# Patient Record
Sex: Female | Born: 1985 | Race: Black or African American | Hispanic: No | Marital: Single | Smoking: Current some day smoker
Health system: Southern US, Community
[De-identification: ages and names within clinical notes are randomized; demographics above are authoritative.]

## PROBLEM LIST (undated history)

## (undated) ENCOUNTER — Inpatient Hospital Stay: Payer: Self-pay

## (undated) DIAGNOSIS — I10 Essential (primary) hypertension: Secondary | ICD-10-CM

## (undated) DIAGNOSIS — J45909 Unspecified asthma, uncomplicated: Secondary | ICD-10-CM

---

## 2012-05-20 ENCOUNTER — Emergency Department: Payer: Self-pay | Admitting: Emergency Medicine

## 2012-05-20 LAB — CBC WITH DIFFERENTIAL/PLATELET
Basophil #: 0.1 10*3/uL (ref 0.0–0.1)
Eosinophil %: 0.3 %
HCT: 37.9 % (ref 35.0–47.0)
HGB: 12.3 g/dL (ref 12.0–16.0)
Lymphocyte #: 3.2 10*3/uL (ref 1.0–3.6)
Lymphocyte %: 29.9 %
MCH: 26.1 pg (ref 26.0–34.0)
MCHC: 32.3 g/dL (ref 32.0–36.0)
Monocyte #: 0.8 x10 3/mm (ref 0.2–0.9)
Neutrophil #: 6.6 10*3/uL — ABNORMAL HIGH (ref 1.4–6.5)
Neutrophil %: 61.8 %
Platelet: 246 10*3/uL (ref 150–440)
RBC: 4.69 10*6/uL (ref 3.80–5.20)
RDW: 16.1 % — ABNORMAL HIGH (ref 11.5–14.5)
WBC: 10.7 10*3/uL (ref 3.6–11.0)

## 2012-05-20 LAB — COMPREHENSIVE METABOLIC PANEL
Alkaline Phosphatase: 96 U/L (ref 50–136)
BUN: 12 mg/dL (ref 7–18)
Bilirubin,Total: 0.2 mg/dL (ref 0.2–1.0)
Calcium, Total: 9 mg/dL (ref 8.5–10.1)
Co2: 21 mmol/L (ref 21–32)
Creatinine: 1.03 mg/dL (ref 0.60–1.30)
EGFR (African American): >60
Glucose: 117 mg/dL — ABNORMAL HIGH (ref 65–99)
Potassium: 3.3 mmol/L — ABNORMAL LOW (ref 3.5–5.1)
SGOT(AST): 23 U/L (ref 15–37)

## 2012-05-20 LAB — ETHANOL
Ethanol %: 0.342 % (ref 0.000–0.080)
Ethanol: 342 mg/dL

## 2012-05-20 LAB — PROTIME-INR
INR: 1.2
Prothrombin Time: 15.2 secs — ABNORMAL HIGH (ref 11.5–14.7)

## 2016-02-03 NOTE — L&D Delivery Note (Signed)
Delivery Note  First Stage: Labor onset: 10:00am  Augmentation : none Analgesia /Anesthesia intrapartum: Nitrous oxide SROM at 1100, clear fluid  Second Stage: Complete dilation at 1200  Onset of pushing at 1200 FHR second stage 125bpm- Cat II, min-mod variability.   Delivery of a viable female 01/23/17 at 1214 by Jaclyn Carew A [name] delivery of fetal head in ROA position with restitution to ROT. Double nuchal cord;  Anterior then posterior shoulders delivered easily with gentle downward traction; delivered via somersault maneuver. Baby placed on mom's chest, and attended to by peds.  Cord double clamped after cessation of pulsation, cut by [name] Cord blood sample collected  Arterial cord blood sample: 7.28  Third Stage: Placenta delivered spontaneously intact with 3 VC @ 1218; noted to have calcifications and area of blood clot near cord inserttion.  Placenta disposition: to pathology Uterine tone: firm / bleeding: small to moderate  Right periurethral laceration identified  Anesthesia for repair: Lidocaine; Toradol given Repair: single interrupted stitch 3-0 Vicryl SH Est. Blood Loss (mL): 75ml  Complications: Precipitous delivery  Mom to postpartum.  Baby to Couplet care / Skin to Skin.  Newborn: Birth Weight: 2460gms Apgar Scores: 8/9 Feeding planned: Formula

## 2016-05-06 ENCOUNTER — Emergency Department: Payer: No Typology Code available for payment source

## 2016-05-06 ENCOUNTER — Encounter: Payer: Self-pay | Admitting: Radiology

## 2016-05-06 ENCOUNTER — Emergency Department
Admission: EM | Admit: 2016-05-06 | Discharge: 2016-05-06 | Disposition: A | Discharge status: Home / Self Care | Payer: No Typology Code available for payment source | Attending: Emergency Medicine | Admitting: Emergency Medicine

## 2016-05-06 DIAGNOSIS — F10929 Alcohol use, unspecified with intoxication, unspecified: Secondary | ICD-10-CM

## 2016-05-06 DIAGNOSIS — M542 Cervicalgia: Secondary | ICD-10-CM | POA: Insufficient documentation

## 2016-05-06 DIAGNOSIS — M549 Dorsalgia, unspecified: Secondary | ICD-10-CM | POA: Insufficient documentation

## 2016-05-06 DIAGNOSIS — Y999 Unspecified external cause status: Secondary | ICD-10-CM | POA: Insufficient documentation

## 2016-05-06 DIAGNOSIS — F10129 Alcohol abuse with intoxication, unspecified: Secondary | ICD-10-CM | POA: Insufficient documentation

## 2016-05-06 DIAGNOSIS — R51 Headache: Secondary | ICD-10-CM | POA: Diagnosis not present

## 2016-05-06 DIAGNOSIS — F1490 Cocaine use, unspecified, uncomplicated: Secondary | ICD-10-CM | POA: Diagnosis not present

## 2016-05-06 DIAGNOSIS — M7918 Myalgia, other site: Secondary | ICD-10-CM

## 2016-05-06 DIAGNOSIS — Y92410 Unspecified street and highway as the place of occurrence of the external cause: Secondary | ICD-10-CM | POA: Diagnosis not present

## 2016-05-06 DIAGNOSIS — Y939 Activity, unspecified: Secondary | ICD-10-CM | POA: Insufficient documentation

## 2016-05-06 DIAGNOSIS — S199XXA Unspecified injury of neck, initial encounter: Secondary | ICD-10-CM | POA: Diagnosis present

## 2016-05-06 LAB — URINE DRUG SCREEN, QUALITATIVE (ARMC ONLY)
AMPHETAMINES, UR SCREEN: NOT DETECTED
BENZODIAZEPINE, UR SCRN: NOT DETECTED
Barbiturates, Ur Screen: NOT DETECTED
Cannabinoid 50 Ng, Ur ~~LOC~~: NOT DETECTED
Cocaine Metabolite,Ur ~~LOC~~: POSITIVE — AB
MDMA (Ecstasy)Ur Screen: NOT DETECTED
METHADONE SCREEN, URINE: NOT DETECTED
Opiate, Ur Screen: NOT DETECTED
Phencyclidine (PCP) Ur S: NOT DETECTED
TRICYCLIC, UR SCREEN: NOT DETECTED

## 2016-05-06 LAB — ETHANOL: ALCOHOL ETHYL (B): 319 mg/dL — AB (ref <5)

## 2016-05-06 MED ORDER — IOPAMIDOL (ISOVUE-300) INJECTION 61%
100.0000 mL | Freq: Once | INTRAVENOUS | Status: AC | PRN
  Administered 2016-05-06: 100 mL via INTRAVENOUS

## 2016-05-06 MED ORDER — ONDANSETRON HCL 4 MG/2ML IJ SOLN
4.0000 mg | Freq: Once | INTRAMUSCULAR | Status: AC
  Administered 2016-05-06: 4 mg via INTRAVENOUS
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
## 2016-05-06 NOTE — ED Notes (Signed)
One episode of vomiting; Zofran given per MD order

## 2016-05-06 NOTE — ED Notes (Signed)
Critical ETOH level called from Lab, EDP notified.
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
## 2016-05-06 NOTE — ED Notes (Signed)
Pt refusing to keep blood pressure cuff, heart monitor and O2 sat on at this time.  Pt belligerent and labile at this time.

## 2016-10-08 ENCOUNTER — Observation Stay
Admission: EM | Admit: 2016-10-08 | Discharge: 2016-10-08 | Disposition: A | Discharge status: Home / Self Care | Payer: Medicaid Other | Attending: Obstetrics and Gynecology | Admitting: Obstetrics and Gynecology

## 2016-10-08 ENCOUNTER — Observation Stay: Payer: Medicaid Other

## 2016-10-08 DIAGNOSIS — Z3A26 26 weeks gestation of pregnancy: Secondary | ICD-10-CM | POA: Insufficient documentation

## 2016-10-08 DIAGNOSIS — F1910 Other psychoactive substance abuse, uncomplicated: Secondary | ICD-10-CM

## 2016-10-08 DIAGNOSIS — O368120 Decreased fetal movements, second trimester, not applicable or unspecified: Secondary | ICD-10-CM | POA: Diagnosis not present

## 2016-10-08 DIAGNOSIS — N939 Abnormal uterine and vaginal bleeding, unspecified: Secondary | ICD-10-CM | POA: Diagnosis present

## 2016-10-08 DIAGNOSIS — O0933 Supervision of pregnancy with insufficient antenatal care, third trimester: Secondary | ICD-10-CM

## 2016-10-08 DIAGNOSIS — O4690 Antepartum hemorrhage, unspecified, unspecified trimester: Secondary | ICD-10-CM

## 2016-10-08 LAB — CHLAMYDIA/NGC RT PCR (ARMC ONLY)
CHLAMYDIA TR: NOT DETECTED
N gonorrhoeae: NOT DETECTED

## 2016-10-08 LAB — URINE DRUG SCREEN, QUALITATIVE (ARMC ONLY)
AMPHETAMINES, UR SCREEN: NOT DETECTED
Barbiturates, Ur Screen: NOT DETECTED
Benzodiazepine, Ur Scrn: NOT DETECTED
CANNABINOID 50 NG, UR ~~LOC~~: NOT DETECTED
COCAINE METABOLITE, UR ~~LOC~~: NOT DETECTED
MDMA (ECSTASY) UR SCREEN: NOT DETECTED
Methadone Scn, Ur: NOT DETECTED
Opiate, Ur Screen: NOT DETECTED
PHENCYCLIDINE (PCP) UR S: NOT DETECTED
Tricyclic, Ur Screen: NOT DETECTED

## 2016-10-08 LAB — URINALYSIS, ROUTINE W REFLEX MICROSCOPIC
BILIRUBIN URINE: NEGATIVE
Glucose, UA: NEGATIVE mg/dL
HGB URINE DIPSTICK: NEGATIVE
KETONES UR: NEGATIVE mg/dL
Leukocytes, UA: NEGATIVE
NITRITE: NEGATIVE
PH: 6 (ref 5.0–8.0)
Protein, ur: NEGATIVE mg/dL
Specific Gravity, Urine: 1.012 (ref 1.005–1.030)

## 2016-10-08 NOTE — OB Triage Note (Signed)
Pt presenst c/o of a fall yesterday around 10 AM. Pt states she landed on her back. No complaints of pain but says she has had decreased fetal movement, and light spotting noticed when she went to the bathroom. Denies any LOF. Pt has not had any PNC. States this was an unplanned pregnancy. Pt is unsure how far along she is but based of her missed period she is estimating about 25 weeks. Will continue to monitor.

## 2016-10-08 NOTE — Discharge Summary (Signed)
Reviewed Discharge instructions with patient. Went over the importance of establishing prenatal care for this pregnancy. Advised pt of what to look for and when she should seek medical attention. Gave pt opportunity for questions. Pt verbalized understanding of all information given. Pt discharged home

## 2016-10-08 NOTE — Final Progress Note (Signed)
L&D OB Triage Note  Dominique Baker is an unassigned 31 y.o. [id number] female at 4070w5d, EDD Estimated Date of Delivery: 01/09/17 (by approximate LMP of 04/05/2016) who presented to triage for complaints of vaginal spotting (mostly when wiping).  Notes that she was s/p ground level fall yesterday (however reports that she landed on her back). Is noting decreased fetal movement today.  She was evaluated by the nurses with no significant findings for vaginal bleeding. Vital signs stable. An NST was performed and has been reviewed by MD.      NST INTERPRETATION: Indications: decreased fetal movement  Mode:  (off for pt to go to ultrasound) Baseline Rate (A): 143 bpm (fht) Variability: Moderate Accelerations: 10 x 10 Decelerations: None     Contraction Frequency (min): none  Impression: reactive    Labs:  Results for orders placed or performed during the hospital encounter of 10/08/16  Chlamydia/NGC rt PCR ([facility] only)  Result Value Ref Range   Specimen source GC/Chlam URINE, RANDOM    Chlamydia Tr NOT DETECTED NOT DETECTED   N gonorrhoeae NOT DETECTED NOT DETECTED  Urinalysis, Routine w reflex microscopic  Result Value Ref Range   Color, Urine YELLOW (A) YELLOW   APPearance CLEAR (A) CLEAR   Specific Gravity, Urine 1.012 1.005 - 1.030   pH 6.0 5.0 - 8.0   Glucose, UA NEGATIVE NEGATIVE mg/dL   Hgb urine dipstick NEGATIVE NEGATIVE   Bilirubin Urine NEGATIVE NEGATIVE   Ketones, ur NEGATIVE NEGATIVE mg/dL   Protein, ur NEGATIVE NEGATIVE mg/dL   Nitrite NEGATIVE NEGATIVE   Leukocytes, UA NEGATIVE NEGATIVE  Urine Drug Screen, Qualitative ([facility] only)  Result Value Ref Range   Tricyclic, Ur Screen NONE DETECTED NONE DETECTED   Amphetamines, Ur Screen NONE DETECTED NONE DETECTED   MDMA (Ecstasy)Ur Screen NONE DETECTED NONE DETECTED   Cocaine Metabolite,Ur [place] NONE DETECTED NONE DETECTED   Opiate, Ur Screen NONE DETECTED NONE DETECTED   Phencyclidine (PCP) Ur S NONE DETECTED NONE  DETECTED   Cannabinoid 50 Ng, Ur [place] NONE DETECTED NONE DETECTED   Barbiturates, Ur Screen NONE DETECTED NONE DETECTED   Benzodiazepine, Ur Scrn NONE DETECTED NONE DETECTED   Methadone Scn, Ur NONE DETECTED NONE DETECTED     Imaging:  CLINICAL DATA:  Vaginal bleeding after falling yesterday. Unsure dates. Evaluate growth and placenta. No prenatal care.  EXAM: OBSTETRICAL ULTRASOUND >14 WKS  FINDINGS: Number of Fetuses: 1  Heart Rate:  139 bpm  Movement: Present  Presentation: Cephalic  Previa: None the distance from the internal cervical os to the inferior margin of the placenta is 7.2 cm  Placental Location: Posterior  Amniotic Fluid (Subjective): Normal  Vertical pocket 3.7 cm  FETAL BIOMETRY  BPD:  5.19cm 21w 5d  HC:    19.41cm  21w   4d  AC:   17.45cm  22w   3d  FL:   3.98cm  22w   6d  Current Mean GA: 22w 1d              US EDC: February 11, 2016  FETAL ANATOMY  Lateral Ventricles: Visualized  [place]/CSP: Visualized  Posterior Fossa:  Visualized  Upper Lip: Visualized  Spine: Visualized  4 Chamber Heart on Left: Visualized  LVOT: Visualized  RVOT: Visualized  Stomach on Left: Visualized but the stomach never appeared fully distended during the study  3 Vessel Cord: Visualized  Cord Insertion site: Visualized  Kidneys: Visualized  Bladder: Visualized  Extremities: Visualized  Sex: Female genitalia  Technically difficult  due to: n/a  Maternal Findings:  No evidence of placental abruption.  Cervix:  3.5 cm and appears closed  IMPRESSION: Single viable IUP with estimated gestational age of [redacted] weeks 1 day with estimated date of confinement of February 11, 2016. No fetal anomalies were demonstrated. The fetal stomach never appeared fully distended during the course of the study.  The placenta is posterior with no evidence of previa or abruption. The amniotic fluid volume is estimated to be  normal. The cervix appears closed.  Plan: NST performed was reviewed and was found to be reactive. Review of prior ER records from ER visit in April showed use of cocaine and ETOH in early pregnancy. UDS today negative.  Ultrasound performed to assess placenta, as well as dating/anatomy. noted that patient's dates were incorrect, is currently only ~22 weeks.  Patient informed. She was discharged home with bleeding/preterm labor precautions.  Advised to establish routine prenatal care. Given information to follow up at [facility].     Hildred Laser, MD [facility]

## 2016-10-09 DIAGNOSIS — F1910 Other psychoactive substance abuse, uncomplicated: Secondary | ICD-10-CM

## 2016-10-27 ENCOUNTER — Telehealth: Payer: Self-pay | Admitting: Obstetrics and Gynecology

## 2016-10-27 NOTE — Telephone Encounter (Signed)
Attempted to contact patient - mailbox is full - unable to leave a message - will attempt again later

## 2016-10-27 NOTE — Telephone Encounter (Signed)
-----   Message from Hildred Laser, MD sent at 10/21/2016  7:42 PM EDT ----- Regarding: follow up OB appointment Please contact this patient to see if she has established prenatal care. She was an unassigned triage patient in L&D several weeks ago, and I would like to have her scheduled to f/u in our office if she has not established care anywhere else.   Thanks,   Dr. Valentino Saxon

## 2016-10-30 NOTE — Telephone Encounter (Signed)
Attempted to contact patient again - no answer - voicemail full

## 2016-11-16 NOTE — Telephone Encounter (Signed)
3rd attempt to contact patient - no answer - mailbox full

## 2017-01-23 ENCOUNTER — Inpatient Hospital Stay
Admission: EM | Admit: 2017-01-23 | Discharge: 2017-01-26 | DRG: 806 | Disposition: A | Discharge status: Home / Self Care | Payer: Medicaid Other | Attending: Obstetrics and Gynecology | Admitting: Obstetrics and Gynecology

## 2017-01-23 ENCOUNTER — Other Ambulatory Visit: Payer: Self-pay

## 2017-01-23 DIAGNOSIS — Z87891 Personal history of nicotine dependence: Secondary | ICD-10-CM

## 2017-01-23 DIAGNOSIS — O114 Pre-existing hypertension with pre-eclampsia, complicating childbirth: Principal | ICD-10-CM | POA: Diagnosis present

## 2017-01-23 DIAGNOSIS — O1494 Unspecified pre-eclampsia, complicating childbirth: Secondary | ICD-10-CM

## 2017-01-23 DIAGNOSIS — O1002 Pre-existing essential hypertension complicating childbirth: Secondary | ICD-10-CM | POA: Diagnosis present

## 2017-01-23 DIAGNOSIS — Z3483 Encounter for supervision of other normal pregnancy, third trimester: Secondary | ICD-10-CM | POA: Diagnosis present

## 2017-01-23 DIAGNOSIS — Z3A37 37 weeks gestation of pregnancy: Secondary | ICD-10-CM

## 2017-01-23 DIAGNOSIS — D62 Acute posthemorrhagic anemia: Secondary | ICD-10-CM | POA: Diagnosis not present

## 2017-01-23 DIAGNOSIS — O9081 Anemia of the puerperium: Secondary | ICD-10-CM | POA: Diagnosis not present

## 2017-01-23 DIAGNOSIS — O0933 Supervision of pregnancy with insufficient antenatal care, third trimester: Secondary | ICD-10-CM

## 2017-01-23 LAB — COMPREHENSIVE METABOLIC PANEL
ALBUMIN: 2.9 g/dL — AB (ref 3.5–5.0)
ALK PHOS: 145 U/L — AB (ref 38–126)
ALT: 13 U/L — ABNORMAL LOW (ref 14–54)
ANION GAP: 9 (ref 5–15)
AST: 27 U/L (ref 15–41)
BILIRUBIN TOTAL: 0.7 mg/dL (ref 0.3–1.2)
BUN: 5 mg/dL — AB (ref 6–20)
CALCIUM: 8.9 mg/dL (ref 8.9–10.3)
CO2: 21 mmol/L — ABNORMAL LOW (ref 22–32)
Chloride: 106 mmol/L (ref 101–111)
Creatinine, Ser: 0.74 mg/dL (ref 0.44–1.00)
GFR calc Af Amer: >60 mL/min (ref >60)
GLUCOSE: 99 mg/dL (ref 65–99)
POTASSIUM: 3 mmol/L — AB (ref 3.5–5.1)
Sodium: 136 mmol/L (ref 135–145)
TOTAL PROTEIN: 6.7 g/dL (ref 6.5–8.1)

## 2017-01-23 LAB — URINE DRUG SCREEN, QUALITATIVE (ARMC ONLY)
Amphetamines, Ur Screen: NOT DETECTED
BARBITURATES, UR SCREEN: NOT DETECTED
BENZODIAZEPINE, UR SCRN: NOT DETECTED
CANNABINOID 50 NG, UR ~~LOC~~: NOT DETECTED
Cocaine Metabolite,Ur ~~LOC~~: NOT DETECTED
MDMA (ECSTASY) UR SCREEN: NOT DETECTED
Methadone Scn, Ur: NOT DETECTED
Opiate, Ur Screen: NOT DETECTED
PHENCYCLIDINE (PCP) UR S: NOT DETECTED
TRICYCLIC, UR SCREEN: NOT DETECTED

## 2017-01-23 LAB — CBC
HEMATOCRIT: 30.7 % — AB (ref 35.0–47.0)
HEMOGLOBIN: 9.8 g/dL — AB (ref 12.0–16.0)
MCH: 25.2 pg — ABNORMAL LOW (ref 26.0–34.0)
MCHC: 31.8 g/dL — AB (ref 32.0–36.0)
MCV: 79.1 fL — ABNORMAL LOW (ref 80.0–100.0)
Platelets: 145 10*3/uL — ABNORMAL LOW (ref 150–440)
RBC: 3.88 MIL/uL (ref 3.80–5.20)
RDW: 16.2 % — AB (ref 11.5–14.5)
WBC: 11.1 10*3/uL — AB (ref 3.6–11.0)

## 2017-01-23 LAB — TYPE AND SCREEN
ABO/RH(D): O POS
ANTIBODY SCREEN: NEGATIVE

## 2017-01-23 LAB — RAPID HIV SCREEN (HIV 1/2 AB+AG)
HIV 1/2 ANTIBODIES: NONREACTIVE
HIV-1 P24 ANTIGEN - HIV24: NONREACTIVE

## 2017-01-23 LAB — PROTEIN / CREATININE RATIO, URINE
Creatinine, Urine: 51 mg/dL
Protein Creatinine Ratio: 0.37 mg/mg{Cre} — ABNORMAL HIGH (ref 0.00–0.15)
Total Protein, Urine: 19 mg/dL

## 2017-01-23 LAB — CHLAMYDIA/NGC RT PCR (ARMC ONLY)
CHLAMYDIA TR: NOT DETECTED
N gonorrhoeae: NOT DETECTED

## 2017-01-23 LAB — OB RESULTS CONSOLE HIV ANTIBODY (ROUTINE TESTING): HIV: NONREACTIVE

## 2017-01-23 MED ORDER — LACTATED RINGERS IV SOLN
INTRAVENOUS | Status: DC
  Administered 2017-01-23 – 2017-01-24 (×3): via INTRAVENOUS

## 2017-01-23 MED ORDER — FLEET ENEMA 7-19 GM/118ML RE ENEM: 1.0000 | ENEMA | Freq: Every day | RECTAL | Status: DC | PRN

## 2017-01-23 MED ORDER — BENZOCAINE-MENTHOL 20-0.5 % EX AERO: 1.0000 "application " | INHALATION_SPRAY | CUTANEOUS | Status: DC | PRN

## 2017-01-23 MED ORDER — OXYTOCIN 10 UNIT/ML IJ SOLN
INTRAMUSCULAR | Status: AC
Start: 2017-01-23 — End: 2017-01-23
  Filled 2017-01-23: qty 2

## 2017-01-23 MED ORDER — AMMONIA AROMATIC IN INHA
RESPIRATORY_TRACT | Status: AC
  Filled 2017-01-23: qty 10

## 2017-01-23 MED ORDER — WITCH HAZEL-GLYCERIN EX PADS: 1.0000 "application " | MEDICATED_PAD | CUTANEOUS | Status: DC | PRN

## 2017-01-23 MED ORDER — BUTORPHANOL TARTRATE 1 MG/ML IJ SOLN: 1.0000 mg | INTRAMUSCULAR | Status: DC | PRN

## 2017-01-23 MED ORDER — OXYTOCIN 40 UNITS IN LACTATED RINGERS INFUSION - SIMPLE MED
2.5000 [IU]/h | INTRAVENOUS | Status: DC
  Administered 2017-01-23: 2.5 [IU]/h via INTRAVENOUS

## 2017-01-23 MED ORDER — CALCIUM GLUCONATE 10 % IV SOLN
INTRAVENOUS | Status: AC
Start: 2017-01-23 — End: 2017-01-24
  Filled 2017-01-23: qty 10

## 2017-01-23 MED ORDER — COCONUT OIL OIL: 1.0000 "application " | TOPICAL_OIL | Status: DC | PRN

## 2017-01-23 MED ORDER — LABETALOL HCL 5 MG/ML IV SOLN
20.0000 mg | INTRAVENOUS | Status: DC | PRN
  Administered 2017-01-24: 20 mg via INTRAVENOUS
  Filled 2017-01-23: qty 4
  Filled 2017-01-23: qty 16

## 2017-01-23 MED ORDER — SODIUM CHLORIDE 0.9% FLUSH: 3.0000 mL | INTRAVENOUS | Status: DC | PRN

## 2017-01-23 MED ORDER — ONDANSETRON HCL 4 MG PO TABS: 4.0000 mg | ORAL_TABLET | ORAL | Status: DC | PRN

## 2017-01-23 MED ORDER — SODIUM CHLORIDE 0.9% FLUSH
3.0000 mL | Freq: Two times a day (BID) | INTRAVENOUS | Status: DC
  Administered 2017-01-25 (×3): 3 mL via INTRAVENOUS

## 2017-01-23 MED ORDER — DIBUCAINE 1 % RE OINT: 1.0000 "application " | TOPICAL_OINTMENT | RECTAL | Status: DC | PRN

## 2017-01-23 MED ORDER — MISOPROSTOL 200 MCG PO TABS
ORAL_TABLET | ORAL | Status: AC
  Filled 2017-01-23: qty 4

## 2017-01-23 MED ORDER — PRENATAL MULTIVITAMIN CH
1.0000 | ORAL_TABLET | Freq: Every day | ORAL | Status: DC
  Administered 2017-01-25 – 2017-01-26 (×2): 1 via ORAL
  Filled 2017-01-23 (×2): qty 1

## 2017-01-23 MED ORDER — SIMETHICONE 80 MG PO CHEW: 80.0000 mg | CHEWABLE_TABLET | ORAL | Status: DC | PRN

## 2017-01-23 MED ORDER — MEASLES, MUMPS & RUBELLA VAC ~~LOC~~ INJ
0.5000 mL | INJECTION | Freq: Once | SUBCUTANEOUS | Status: DC
  Filled 2017-01-23: qty 0.5

## 2017-01-23 MED ORDER — LABETALOL HCL 5 MG/ML IV SOLN: 20.0000 mg | Freq: Once | INTRAVENOUS | Status: DC

## 2017-01-23 MED ORDER — TETANUS-DIPHTH-ACELL PERTUSSIS 5-2.5-18.5 LF-MCG/0.5 IM SUSP
0.5000 mL | Freq: Once | INTRAMUSCULAR | Status: AC
  Administered 2017-01-26: 0.5 mL via INTRAMUSCULAR
  Filled 2017-01-23 (×2): qty 0.5

## 2017-01-23 MED ORDER — ONDANSETRON HCL 4 MG/2ML IJ SOLN: 4.0000 mg | Freq: Four times a day (QID) | INTRAMUSCULAR | Status: DC | PRN

## 2017-01-23 MED ORDER — ONDANSETRON HCL 4 MG/2ML IJ SOLN: 4.0000 mg | INTRAMUSCULAR | Status: DC | PRN

## 2017-01-23 MED ORDER — ACETAMINOPHEN 325 MG PO TABS: 650.0000 mg | ORAL_TABLET | ORAL | Status: DC | PRN

## 2017-01-23 MED ORDER — DIPHENHYDRAMINE HCL 25 MG PO CAPS: 25.0000 mg | ORAL_CAPSULE | Freq: Four times a day (QID) | ORAL | Status: DC | PRN

## 2017-01-23 MED ORDER — SODIUM CHLORIDE 0.9 % IV SOLN: 250.0000 mL | INTRAVENOUS | Status: DC | PRN

## 2017-01-23 MED ORDER — OXYTOCIN 40 UNITS IN LACTATED RINGERS INFUSION - SIMPLE MED
INTRAVENOUS | Status: AC
  Administered 2017-01-23: 500 mL via INTRAVENOUS
  Filled 2017-01-23: qty 1000

## 2017-01-23 MED ORDER — ZOLPIDEM TARTRATE 5 MG PO TABS: 5.0000 mg | ORAL_TABLET | Freq: Every evening | ORAL | Status: DC | PRN

## 2017-01-23 MED ORDER — SENNOSIDES-DOCUSATE SODIUM 8.6-50 MG PO TABS
2.0000 | ORAL_TABLET | ORAL | Status: DC
  Administered 2017-01-24 – 2017-01-25 (×3): 2 via ORAL
  Filled 2017-01-23 (×4): qty 2

## 2017-01-23 MED ORDER — LABETALOL HCL 5 MG/ML IV SOLN
INTRAVENOUS | Status: AC
  Administered 2017-01-23: 20 mg
  Filled 2017-01-23: qty 4

## 2017-01-23 MED ORDER — BISACODYL 10 MG RE SUPP
10.0000 mg | Freq: Every day | RECTAL | Status: DC | PRN
  Filled 2017-01-23: qty 1

## 2017-01-23 MED ORDER — MAGNESIUM SULFATE 50 % IJ SOLN
INTRAVENOUS | Status: AC
  Filled 2017-01-23: qty 80

## 2017-01-23 MED ORDER — LIDOCAINE HCL (PF) 1 % IJ SOLN
INTRAMUSCULAR | Status: AC
  Administered 2017-01-23: 30 mL via SUBCUTANEOUS
  Filled 2017-01-23: qty 30

## 2017-01-23 MED ORDER — LABETALOL HCL 5 MG/ML IV SOLN: 20.0000 mg | INTRAVENOUS | Status: DC | PRN

## 2017-01-23 MED ORDER — KETOROLAC TROMETHAMINE 30 MG/ML IJ SOLN
INTRAMUSCULAR | Status: AC
  Administered 2017-01-23: 60 mg via INTRAMUSCULAR
  Filled 2017-01-23: qty 2

## 2017-01-23 MED ORDER — OXYTOCIN BOLUS FROM INFUSION
500.0000 mL | Freq: Once | INTRAVENOUS | Status: AC
  Administered 2017-01-23: 500 mL via INTRAVENOUS

## 2017-01-23 MED ORDER — IBUPROFEN 600 MG PO TABS
600.0000 mg | ORAL_TABLET | Freq: Four times a day (QID) | ORAL | Status: DC
  Administered 2017-01-23 – 2017-01-24 (×4): 600 mg via ORAL
  Filled 2017-01-23 (×4): qty 1

## 2017-01-23 MED ORDER — KETOROLAC TROMETHAMINE 60 MG/2ML IM SOLN
60.0000 mg | Freq: Once | INTRAMUSCULAR | Status: AC
  Administered 2017-01-23: 60 mg via INTRAMUSCULAR
  Filled 2017-01-23: qty 2

## 2017-01-23 MED ORDER — MAGNESIUM SULFATE BOLUS VIA INFUSION
4.0000 g | Freq: Once | INTRAVENOUS | Status: AC
  Administered 2017-01-23: 4 g via INTRAVENOUS
  Filled 2017-01-23: qty 500

## 2017-01-23 MED ORDER — SOD CITRATE-CITRIC ACID 500-334 MG/5ML PO SOLN: 30.0000 mL | ORAL | Status: DC | PRN

## 2017-01-23 MED ORDER — LACTATED RINGERS IV SOLN: 500.0000 mL | INTRAVENOUS | Status: DC | PRN

## 2017-01-23 MED ORDER — MAGNESIUM SULFATE 50 % IJ SOLN
2.0000 g/h | INTRAVENOUS | Status: DC
  Filled 2017-01-23: qty 80

## 2017-01-23 MED ORDER — LIDOCAINE HCL (PF) 1 % IJ SOLN
30.0000 mL | INTRAMUSCULAR | Status: DC | PRN
  Administered 2017-01-23: 30 mL via SUBCUTANEOUS

## 2017-01-23 MED ORDER — OXYCODONE HCL 5 MG PO TABS: 5.0000 mg | ORAL_TABLET | ORAL | Status: DC | PRN

## 2017-01-23 NOTE — Progress Notes (Signed)
Post Partum Day 0 Subjective:  Notified by RN of persistent elevated BPS in severe range 160-180/100-110. Pt now reporting remote hx elevated BPs on medications in the past.  No CP SOB Fever,Chills, N/V or leg pain; denies nipple or breast pain Denies HA change of vision, RUQ/epigastric pain  Objective:  BP (!) 148/72   Pulse 74   Temp (!) 97.3 F (36.3 C) (Oral)   Resp 18   Ht _0  (1.676 m)   Wt 254 lb (115.2 kg)   SpO2 100%   BMI 41.00 kg/m    Physical Exam:  General: NAD Breasts: soft/nontender CV: RRR Pulm: nl effort, CTABL Abdomen: soft, NT, BS x 4 Perineum: minimal edema, lacerations hemostatic/repair well approximated Lochia: small Uterine Fundus: fundus firm and 1 fb below umbilicus DVT Evaluation: no cords, ttp LEs   Recent Labs    01/23/17 1233  HGB 9.8*  HCT 30.7*  WBC 11.1*  PLT 145*    Assessment/Plan: 31 y.o. [id number] postpartum day # 0  1. Pre-eclampsia with severe features- - Magnesium 4gm load, 2gm/hr -strict I/Os - 1 dose Labetalol 25m IV given-BPs now 148-169/69-93. - repeat labs in AM  -POC d/w and comanaged with Dr WLeonides Schanz    2. S/P SVD at [redacted]w[redacted]d Continue routine PP care - encouraged snug fitting bra and cabbage leaves for bottlefeeding.   - Immunization status: Needs  tdap and flu, consider varicella, MMR prior to DC   Disposition: remain on L&D for magnesium sulfate admin- close monitoring.     Jabrea Kallstrom, RENew KensingtonCNM 01/23/17 2:48 PM
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
## 2017-01-23 NOTE — H&P (Signed)
OB History & Physical   History of Present Illness:  Chief Complaint: Painful UCs and water broke at 1100  HPI:  Dominique Baker is Baker 31 y.o. [id number] female at 4160w3d dated by US at 627w1d.  She presents to L&D for labor and SROM  Active FM onset of ctx @ 1100 currently every 2-3 minutes LOF  / SROM @ 1100,, clear fluid bloody show normal    Pregnancy Issues: 1. No prenatal care, seen at [facility] 10/2016 for confirmation of pregnancy.  2. Hx substance abuse- +cocaine 10/2016   Maternal Medical History:  History reviewed. No pertinent past medical history.   History reviewed. No pertinent surgical history.  No Known Allergies  Prior to Admission medications   Not on File     Prenatal care site: no care Reports hx preterm birth x 2, SVD x 3- 1st term 6lbs, 12yo; , 2nd baby approx 2 lbs with NICU stay, age 31yo, 3rd baby age 61-also preterm.   Social History: She  reports that she quit smoking about 6 months ago. She smoked 0.25 packs per day. she has never used smokeless tobacco. She reports that she does not drink alcohol or use drugs.  Family History: family history is not on file. family hx diabetes.   Review of Systems: Baker full review of systems was performed and negative except as noted in the HPI.     Physical Exam:  Vital Signs: There were no vitals taken for this visit. initial BP elevated x 2 General: no acute distress. Crying with UCs  HEENT: normocephalic, atraumatic Heart: regular rate & rhythm.  No murmurs/rubs/gallops Lungs: clear to auscultation bilaterally, normal respiratory effort Abdomen: soft, gravid, non-tender;  EFW: 6lbs Pelvic:   External: Normal external female genitalia  Cervix: Dilation: 4 / Effacement (%): 90 / Station: -2    Extremities: non-tender, symmetric,trace edema bilaterally.  DTRs: 2+ Neurologic: Alert & oriented x 3.    No results found for this or any previous visit (from the past 24 hour(s)).  Pertinent Results:  Prenatal  Labs: Blood type/Rh pending  Antibody screen   Rubella   Varicella   RPR   HBsAg   HIV   GC Neg 10/08/16  Chlamydia Neg 10/08/16  Genetic screening Not done  1 hour GTT Not done  3 hour GTT   GBS Not done   FHT: 130bpm, mod variability, + accels, 1 variable decel noted with UC.  TOCO: not tracing well, palpate UCs q2-593min SVE:  Dilation: 4 / Effacement (%): 90 / Station: -2    Cephalic by leopolds and RN SVE.   No results found.  Assessment:  Dominique Baker is Baker 31 y.o. [phone] female at 7960w3d with ruptured membranes and labor.  1. No prenatal care, current pregnancy 2. Hx substance abuse.  Plan:  1. Admit to Labor & Delivery; consents reviewed and obtained  2. Fetal Well being  - Fetal Tracing: Cat I tracing, 1 variable decel earlier.  - Group B Streptococcus ppx indicated: none- term pt - Presentation: vertex confirmed by exam   3. Routine OB: - Prenatal labs reviewed, as above - Rh-pending.  - CBC, T&S, RPR- prenatal labs on admit - Clear fluids, IVF  4. Monitoring of Labor -  Contractions: external toco in place -  Pelvis proven to approx 6lbs -  Plan for continuous fetal monitoring  -  Maternal pain control as desired; requesting IVPM, nitrous, regional anesthesia - Anticipate vaginal delivery  5. UDS sent due to hx  substance abuse, SW consult, d/t no care and hx.   6. Elevated BPs- pt reports hx HTN previously on meds. Labs ordered, will monitor closely  Dominique Baker, [name] 01/23/17 12:36 PM

## 2017-01-23 NOTE — Discharge Summary (Signed)
Obstetrical Discharge Summary  Patient Name: Dominique Baker DOB: February 28, 1985 MRN: [id number]  Date of Admission: 01/23/2017 Date of Delivery: 01/23/17 Delivered by: Bonnell Public McVey [name] Date of Discharge: 01/26/2017  Primary OB: no prenatal care LMP:No LMP recorded. Patient is pregnant. EDC Estimated Date of Delivery: 02/10/17 Gestational Age at Delivery: 1635w3d   Antepartum complications: no prenatal care Admitting Diagnosis: ruptured membranes, term Secondary Diagnosis: hx substance abuse 10/2016  Patient Active Problem List   Diagnosis Date Noted  . Indication for care in labor or delivery 01/23/2017  . Pre-eclampsia, delivered 01/23/2017  . Polysubstance abuse (HCC) 10/09/2016  . Vaginal spotting 10/08/2016  . No prenatal care in current pregnancy in third trimester 10/08/2016    Augmentation: none Complications: None Intrapartum complications/course: none Date of Delivery: 01/23/17 Delivered By: Genia DelMargaret Joaopedro Eschbach, [name] Delivery Type: spontaneous vaginal delivery Anesthesia: Nitrous oxide Placenta: spontaneous Laceration: right periurethral Episiotomy: none Newborn Data: Live born female   Newborn Delivery   Birth date/time:  01/23/2017 12:14:00 Delivery type:  Vaginal, Spontaneous     Post partum course:  Patient had a postpartum course complicated by preeclampsia with severe range blood pressures. She received a 4g bolus of magnesium sulfate, followed by 24 hours of continous magnesium sulfate 2g/hr. She was given 20mg  of IV labetalol, which was followed by PO nifedipine. Her BP continued to be elevated, at which point PO labetalol was added.   By time of discharge on PPD#3, her pain was controlled on oral pain medications, she had appropriate lochia, was ambulating, voiding without difficulty and tolerating regular diet. Her blood pressure was controlled on 60mg  PO nifedipine qd and 400mg  PO labetalol BID.  She was deemed stable for discharge to home.    Discharge Physical  Exam:  BP 136/80 (BP Location: Right Arm)   Pulse 75   Temp (!) 97.4 F (36.3 C) (Oral)   Resp 18   Ht 5\' 6"  (1.676 m)   Wt 115.2 kg (254 lb)   SpO2 99%   BMI 41.00 kg/m   General: NAD CV: RRR Pulm: CTABL, nl effort ABD: s/nd/nt, fundus firm and below the umbilicus Lochia: moderate DVT Evaluation: LE non-ttp, no evidence of DVT on exam.  Hemoglobin  Date Value Ref Range Status  01/24/2017 9.1 (L) 12.0 - 16.0 g/dL Final   HGB  Date Value Ref Range Status  05/20/2012 12.3 12.0 - 16.0 g/dL Final   HCT  Date Value Ref Range Status  01/24/2017 28.9 (L) 35.0 - 47.0 % Final  05/20/2012 37.9 35.0 - 47.0 % Final     Disposition: stable, discharge to home Baby Feeding: formula Baby Disposition: home with mom  Rh Immune globulin given: n/a Rubella vaccine given: n/a Varicella vaccine given: status unknown d/t lack of PNC Tdap vaccine given in AP: unknown Flu vaccine given in AP: unknown  Contraception: still deciding  Prenatal Labs: Blood type/Rh O+  Antibody screen neg  Rubella immune  Varicella Not done  RPR NR  HBsAg neg  HIV NR  GC Neg 10/08/16  Chlamydia Neg 10/08/16  Genetic screening Not done  1 hour GTT Not done  GBS Not done   UDS: Negative on admission   Plan:  Dominique Baker was discharged to home in good condition. Reviewed the importance of good follow-up. Advised that she should make appointments at [facility] for a blood pressure check this Friday as well as a routine 6 week postpartum visit with [name]. She should also make an appointment with her PCP as  soon as possible for management of blood pressure.   Evening dose of labetalol to be given before discharge, with recheck of BP about an hour later. Reviewed the importance of blood pressure management and need to pick up prescriptions from the pharmacy first thing tomorrow morning to stay on schedule with BP medications.   Discharge Medications: Allergies as of 01/26/2017    No Known Allergies     Medication List    TAKE these medications   labetalol 200 MG tablet Commonly known as:  NORMODYNE Take 2 tablets (400 mg total) by mouth 2 (two) times daily.   NIFEdipine 60 MG 24 hr tablet Commonly known as:  PROCARDIA-XL/ADALAT CC Take 1 tablet (60 mg total) by mouth daily. Start taking on:  01/27/2017       Follow-up Information    McVey, Prudencio PairRebecca A, [name]. Schedule an appointment as soon as possible for a visit in 3 day(s).   Specialty:  Obstetrics and Gynecology Why:  Please make an appointment for a blood pressure check for this Friday, December 28 as well as for 6 weeks postpartum for a routine check Contact information: [address] [place] [address] [phone]          Blood pressure medications and management reviewed with Dr. Christeen DouglasBethany Beasley, who is in agreement with plan as outlined above.   Signed: Genia DelMargaret Shadai Mcclane, [name] 01/26/2017 2:17 PM

## 2017-01-24 LAB — CBC
HEMATOCRIT: 28.9 % — AB (ref 35.0–47.0)
Hemoglobin: 9.1 g/dL — ABNORMAL LOW (ref 12.0–16.0)
MCH: 25 pg — AB (ref 26.0–34.0)
MCHC: 31.6 g/dL — AB (ref 32.0–36.0)
MCV: 79.1 fL — AB (ref 80.0–100.0)
PLATELETS: 145 10*3/uL — AB (ref 150–440)
RBC: 3.66 MIL/uL — ABNORMAL LOW (ref 3.80–5.20)
RDW: 16.2 % — AB (ref 11.5–14.5)
WBC: 11.1 10*3/uL — AB (ref 3.6–11.0)

## 2017-01-24 LAB — COMPREHENSIVE METABOLIC PANEL
ALBUMIN: 2.4 g/dL — AB (ref 3.5–5.0)
ALT: 13 U/L — AB (ref 14–54)
AST: 30 U/L (ref 15–41)
Alkaline Phosphatase: 122 U/L (ref 38–126)
Anion gap: 6 (ref 5–15)
CHLORIDE: 108 mmol/L (ref 101–111)
CO2: 24 mmol/L (ref 22–32)
CREATININE: 0.63 mg/dL (ref 0.44–1.00)
Calcium: 7.5 mg/dL — ABNORMAL LOW (ref 8.9–10.3)
GFR calc Af Amer: >60 mL/min (ref >60)
GFR calc non Af Amer: >60 mL/min (ref >60)
GLUCOSE: 104 mg/dL — AB (ref 65–99)
POTASSIUM: 3.3 mmol/L — AB (ref 3.5–5.1)
Sodium: 138 mmol/L (ref 135–145)
Total Bilirubin: 0.6 mg/dL (ref 0.3–1.2)
Total Protein: 5.9 g/dL — ABNORMAL LOW (ref 6.5–8.1)

## 2017-01-24 LAB — RUBELLA SCREEN: RUBELLA: 1.28 {index} (ref >0.99)

## 2017-01-24 LAB — RPR: RPR: NONREACTIVE

## 2017-01-24 LAB — HEPATITIS B SURFACE ANTIGEN: HEP B S AG: NEGATIVE

## 2017-01-24 MED ORDER — NIFEDIPINE 10 MG PO CAPS
20.0000 mg | ORAL_CAPSULE | Freq: Once | ORAL | Status: AC
  Administered 2017-01-24: 20 mg via ORAL
  Filled 2017-01-24: qty 2

## 2017-01-24 MED ORDER — NIFEDIPINE ER OSMOTIC RELEASE 30 MG PO TB24
ORAL_TABLET | ORAL | Status: AC
  Filled 2017-01-24: qty 1

## 2017-01-24 MED ORDER — NIFEDIPINE ER OSMOTIC RELEASE 30 MG PO TB24
30.0000 mg | ORAL_TABLET | Freq: Every day | ORAL | Status: DC
  Administered 2017-01-24: 30 mg via ORAL

## 2017-01-24 MED ORDER — IBUPROFEN 600 MG PO TABS
600.0000 mg | ORAL_TABLET | Freq: Four times a day (QID) | ORAL | Status: DC
  Administered 2017-01-25 – 2017-01-26 (×8): 600 mg via ORAL
  Filled 2017-01-24 (×8): qty 1

## 2017-01-24 MED ORDER — NIFEDIPINE ER OSMOTIC RELEASE 30 MG PO TB24
60.0000 mg | ORAL_TABLET | Freq: Every day | ORAL | Status: DC
  Filled 2017-01-24: qty 2

## 2017-01-24 NOTE — Progress Notes (Signed)
Post Partum Day 1  Subjective: Doing well, no complaints.  Tolerating regular diet, pain with PO meds, voiding and ambulating without difficulty.  No CP SOB Fever,Chills, N/V or leg pain; denies nipple or breast pain no HA change of vision, RUQ/epigastric pain  Objective: BP 124/69   Pulse 78   Temp 98.7 F (37.1 C) (Oral)   Resp 18   Ht 5' 6"  (1.676 m)   Wt 254 lb (115.2 kg)   SpO2 100%   BMI 41.00 kg/m    Physical Exam:  General: NAD Breasts: soft/nontender CV: RRR Pulm: nl effort, CTABL Abdomen: soft, NT, BS x 4 Perineum: minimal edema, intact/periurethral lac well approx/hemostatic. Lochia: moderate Uterine Fundus: fundus firm and 3 fb below umbilicus DVT Evaluation: no cords, ttp LEs    Intake/Output Summary (Last 24 hours) at 01/24/2017 1035 Last data filed at 01/24/2017 0900 Gross per 24 hour  Intake 4472.36 ml  Output 5221 ml  Net -748.64 ml    Recent Labs    01/23/17 1233 01/24/17 0526  HGB 9.8* 9.1*  HCT 30.7* 28.9*  WBC 11.1* 11.1*  PLT 145* 145*  CMP reviewed- WNL.   Assessment/Plan: 31 y.o. [id number] postpartum day # 1 Pre-eclampsia No prenatal care  - Monitor BP closely and for s/sx pulm edema, continues to have mild range BPs.  - Continue magnesium until 24hrs after start time.  - encouraged snug fitting bra and cabbage leaves for bottlefeeding.  - Discussed contraceptive options including implant, IUDs hormonal and non-hormonal, injection, pills/ring/patch, condoms, and NFP.  - mild anemia- chronic and present on admission.  hemodynamically stable and asymptomatic; start po ferrous sulfate BID with stool softeners  - Immunization status: Needs tdap, varicella, MMR and flu prior to DC   Disposition: remain inpt on Mag sulfate drip until 24hrs then transfer to postpartum when stable.     McVey, [place], [name] 01/24/17 10:31 AM

## 2017-01-24 NOTE — Progress Notes (Signed)
S: called by RN for elvated BPs persistently in severe range over last 20min. Pt remains asymptomatic.   O:  Vitals:   01/24/17 1242 01/24/17 1251  BP: (!) 165/92 (!) 198/105  Pulse: 80 78  Resp:    Temp:    SpO2:    198/105 on retake.    Intake/Output Summary (Last 24 hours) at 01/24/2017 1300 Last data filed at 01/24/2017 1134 Gross per 24 hour  Intake 5018.2 ml  Output 5146 ml  Net -127.8 ml   A: 31 y.o. [id number] postpartum day # 1 Pre-eclampsia-severe with hx previous CHTN years ago.  No prenatal care  P:  D/w Dr Elesa MassedWard.  Continue Magsulfate  IV Labetalol 20mg  IV now.  Will start PO Nifedipine 30mg  PO XL.    McVey, [name], [name]  01/24/17 1:17 PM

## 2017-01-24 NOTE — Clinical Social Work Maternal (Signed)
  CLINICAL SOCIAL WORK MATERNAL/CHILD NOTE  Patient Details  Name: Dominique Baker MRN: [id number] Date of Birth: 1985-02-22  Date:  01/24/2017  Clinical Social Worker Initiating Note:  Santiago Bumpers, MSW, [place]  Date/Time: Initiated:  01/24/17/      Child's Name:  Dominique Baker   Biological Parents:  Mother   Need for Interpreter:  None   Reason for Referral:  Current Substance Use/Substance Use During Pregnancy , Late or No Prenatal Care    Address:  [address] [place] [address] [place] [address]    Phone number:  [phone] (home)     Additional phone number: [phone]  Household Members/Support Persons (HM/SP):       HM/SP Name Relationship DOB or Age  HM/SP -1  None      HM/SP -2        HM/SP -3        HM/SP -4        HM/SP -5        HM/SP -6        HM/SP -7        HM/SP -8          Natural Supports (not living in the home):  Prien, Medical laboratory scientific officer, Extended Family, Friends, Immediate Family   Professional Supports:     Employment: Animator   Type of Work: Refused to answer   Education:  High school graduate   Homebound arranged:    Museum/gallery curator Resources:  Medicaid   Other Resources:  Physicist, medical , [place] Considerations Which May Impact Care:  None noted  Strengths:  Ability to meet basic needs , Engineer, materials, Home prepared for child    Psychotropic Medications:         Pediatrician:    [employer]  Pediatrician List:   [place]      Pediatrician Fax Number:    Risk Factors/Current Problems:  Substance Use , Compliance with Treatment    Cognitive State:  Alert    Mood/Affect:  Other (Comment)(Evasive)   CSW Assessment: The CSW met with the patient and her child at bedside to discuss her history of substance use and limited prenatal care. The patient indicated that she last used  substances in November when she took a half of an "E-pill" (MDMA) for her birthday. The patient declined information about substance use resources. The patient has 3 other children (Dominique Baker-13, Dominique Baker-12, Dominique Baker-9), and she states that she has never had an open CPS case. The CSW explained that she and the child are currently negative for all substances via UDS; however, cord blood results are pending. The CSW advised that should the results be positive for any substances, the hospital is mandated to contact CPS. The patient asked appropriate questions about the CPS process which the CSW answered.  The patient has chosen a pediatrician and is open to the idea of self-care through follow-up. The CSW counseled the patient on the correlation between substance use and HTN and strongly cautioned the need for follow-up care. The patient verbalized understanding and was pleasant (but evasive) during the assessment.  CSW Plan/Description:  CSW Will Continue to Monitor Umbilical Cord Tissue Drug Screen Results and Make Report if Frutoso Schatz, LCSW 01/24/2017, 2:04 PM

## 2017-01-25 MED ORDER — LABETALOL HCL 200 MG PO TABS
200.0000 mg | ORAL_TABLET | Freq: Every day | ORAL | Status: DC
  Administered 2017-01-25: 200 mg via ORAL
  Filled 2017-01-25: qty 1

## 2017-01-25 MED ORDER — NIFEDIPINE ER OSMOTIC RELEASE 30 MG PO TB24
60.0000 mg | ORAL_TABLET | Freq: Every day | ORAL | Status: DC
Start: 2017-01-25 — End: 2017-01-27
  Administered 2017-01-25 – 2017-01-26 (×2): 60 mg via ORAL
  Filled 2017-01-25: qty 2

## 2017-01-25 MED ORDER — LABETALOL HCL 200 MG PO TABS
400.0000 mg | ORAL_TABLET | Freq: Every day | ORAL | Status: DC
  Administered 2017-01-25 – 2017-01-26 (×2): 400 mg via ORAL
  Filled 2017-01-25 (×2): qty 2

## 2017-01-25 NOTE — Progress Notes (Signed)
Per patient and RN patient has given her debit care to someone to go out and buy a car seat today. Please reconsult if future social work needs arise. CSW signing off.   [employer]iley Yvett Rossel, LCSW [phone]

## 2017-01-25 NOTE — Progress Notes (Signed)
Post Partum Day 2 Subjective: no complaints  Pt denies HA, SOB, dizziness, calf pain  Objective: Blood pressure (!) 120/54, pulse 70, temperature 98.8 F (37.1 C), temperature source Oral, resp. rate 18, height 5\' 6"  (1.676 m), weight 115.2 kg (254 lb), SpO2 100 %.  Physical Exam:  General: alert, cooperative and appears stated age Lochia: appropriate Uterine Fundus: firm DVT Evaluation: No evidence of DVT seen on physical exam. Negative Homan's sign. Pulm: CTAB in 6 lung fields CV: RRR, no murmus  Recent Labs    01/23/17 1233 01/24/17 0526  HGB 9.8* 9.1*  HCT 30.7* 28.9*    Assessment/Plan:  1. Severe range BP, s/p 24hr magnesium. She has chronic HTN, but insufficient prenatal care with no records of BP through pregnancy. Hx of med use prior to pregnancy, not recently.  - s/p diuresis - on Procardia XL 60mg  in am. I added labetolol this morning in face of persistently elevated BP. 400mg  po lab in am and plan for 200mg  po at night. Will reassess this evening. - pt does have PCP as outpatient, hasn't seen for a year. I have encouraged her to make an appointment for consistent control of cHTN as outpatient.  2. Acute blood loss anemia on chronic anemia - on po iron  3. Continued ambulation 4. Regular diet 5. Hx of drug use: drug screen negative on admission 6 . Bottle feeding   LOS: 2 days   Dominique Baker 01/25/2017, 1:05 PM

## 2017-01-25 NOTE — Progress Notes (Signed)
Clinical Child psychotherapistocial Worker (CSW) received a call from [employer] stating that patient and infant are discharging today and a car seat test needs to be done. CSW spoke with patient via telephone to discuss the need for a car seat. Per patient she believes her sister ordered a car seat on line at [employer] [place]. Per patient she is working on confirming with her sister and [employer] that the car seat has been purchased and is ready for pick up. Per patient she has someone that can pick up the car seat from [employer] [facility] and [facility]. CSW will continue to follow and assist as needed.   B[employer]Bailey Lashana Spang, LCSW [phone]

## 2017-01-26 MED ORDER — LABETALOL HCL 200 MG PO TABS: 600.0000 mg | ORAL_TABLET | Freq: Every day | ORAL | Status: DC

## 2017-01-26 MED ORDER — LABETALOL HCL 200 MG PO TABS
400.0000 mg | ORAL_TABLET | Freq: Every day | ORAL | Status: DC
  Administered 2017-01-26: 400 mg via ORAL
  Filled 2017-01-26: qty 2

## 2017-01-26 MED ORDER — NIFEDIPINE ER 60 MG PO TB24: 60.0000 mg | ORAL_TABLET | Freq: Every day | ORAL | 2 refills | Status: DC

## 2017-01-26 MED ORDER — LABETALOL HCL 200 MG PO TABS: 400.0000 mg | ORAL_TABLET | Freq: Two times a day (BID) | ORAL | 2 refills | Status: DC

## 2017-01-26 MED ORDER — LABETALOL HCL 300 MG PO TABS: 600.0000 mg | ORAL_TABLET | Freq: Two times a day (BID) | ORAL | 2 refills | Status: DC

## 2017-01-26 NOTE — Progress Notes (Signed)
pt discharged home with infant.  Discharge instructions, prescriptions and follow up appointment given to and reviewed with pt.  Pt verbalized understanding, all questions answered.  Escorted by auxiliary. 

## 2017-01-26 NOTE — Discharge Instructions (Signed)
After Your Delivery Discharge Instructions   Postpartum: Care Instructions  After childbirth (postpartum period), your body goes through many changes. Some of these changes happen over several weeks. In the hours after delivery, your body will begin to recover from childbirth while it prepares to breastfeed your newborn. You may feel emotional during this time. Your hormones can shift your mood without warning for no clear reason.  In the first couple of weeks after childbirth, many women have emotions that change from happy to sad. You may find it hard to sleep. You may cry a lot. This is called the "baby blues." These overwhelming emotions often go away within a couple of days or weeks. But it's important to discuss your feelings with your doctor.  You should call your care provider if you have unrelieved feelings of:  Inability to cope  Sadness  Anxiety  Lack of interest in baby  Insomnia  Crying  It is easy to get too tired and overwhelmed during the first weeks after childbirth. Don't try to do too much. Get rest whenever you can, accept help from others, and eat well and drink plenty of fluids.  About 4 to 6 weeks after your baby's birth, you will have a follow-up visit with your care provider. This visit is your time to talk to your provider about anything you are concerned or curious about.  Follow-up care is a key part of your treatment and safety. Be sure to make and go to all appointments, and call your doctor if you are having problems. It's also a good idea to know your test results and keep a list of the medicines you take.  How can you care for yourself at home?  Sleep or rest when your baby sleeps.  Get help with household chores from family or friends, if you can. Do not try to do it all yourself.  If you have hemorrhoids or swelling or pain around the opening of your vagina, try using cold and heat. You can put ice or a cold pack on the area for 10 to 20 minutes at  a time. Put a thin cloth between the ice and your skin. Also try sitting in a few inches of warm water (sitz bath) 3 times a day and after bowel movements.  Take pain medicines exactly as directed.  If the provider gave you a prescription medicine for pain, take it as prescribed.  If you do not have a prescription and need something over the counter, you can take:  Ibuprofen (Motrin, Advil) up to 600mg every 6 hours as needed for pain  Acetaminophen (Tylenol) up to 650mg every 4 hours as needed for pain  Some people find it helpful to alternate between these two medications.   No driving for 1-2 weeks or while taking pain medications.   Eat more fiber to avoid constipation. Include foods such as whole-grain breads and cereals, raw vegetables, raw and dried fruits, and beans.  Drink plenty of fluids, enough so that your urine is light yellow or clear like water. If you have kidney, heart, or liver disease and have to limit fluids, talk with your doctor before you increase the amount of fluids you drink.  Do not put anything in the vagina for 6 weeks. This means no sex, no tampons, no douching, and no enemas.  If you have stitches, keep the area clean by pouring or spraying warm water over the area outside your vagina and anus after you use the toilet.    No strenuous activity or heavy lifting for 6 weeks   No tub baths; showers only  Continue prenatal vitamin and iron.  If breastfeeding:  Increase calories and fluids while breastfeeding.  You may have a slight fever when your milk comes in, but it should go away on its own. If it does not, and rises above 101.0 please call the doctor.  For breastfeeding concerns, the lactation consultant can be reached at [phone].  For concerns about your baby, please call your pediatrician.   Keep a list of questions to bring to your postpartum visit. Your questions might be about:  Changes in your breasts, such as lumps or  soreness.  When to expect your menstrual period to start again.  What form of birth control is best for you.  Weight you have put on during the pregnancy.  Exercise options.  What foods and drinks are best for you, especially if you are breastfeeding.  Problems you might be having with breastfeeding.  When you can have sex. Some women may want to talk about lubricants for the vagina.  Any feelings of sadness or restlessness that you are having.   When should you call for help?  Call 911 anytime you think you may need emergency care. For example, call if:  You have thoughts of harming yourself, your baby, or another person.  You passed out (lost consciousness).  Call the office at [phone] or seek immediate medical care if:  If you have heavy bleeding such that you are soaking 1 pad in an hour for 2 hours  You are dizzy or lightheaded, or you feel like you may faint.  You have a fever; a temperature of 101.0 F or greater  Chills  Difficulty urinating  Headache unrelieved by "pain meds"   Visual changes  Pain in the right side of your belly near your ribs  Breasts reddened, hard, hot to the touch or any other breast concerns  Nipple discharge which is foul-smelling or contains pus   Increased pain at the site of the laceration   New pain unrelieved with recommended over-the-counter dosages  Difficulty breathing with or without chest pain   New leg pain, swelling, or redness, especially if it is only on one leg  Any other concerns  Watch closely for changes in your health, and be sure to contact your provider if:  You have new or worse vaginal discharge.  You feel sad or depressed.  You are having problems with your breasts or breastfeeding.    

## 2017-01-26 NOTE — Progress Notes (Signed)
Spoke with RN Emmit PomfretLydia Vernon regarding patient's BPs 130s-150s/80s-100s and further discussed with Dr. Christeen DouglasBethany Beasley. Patient can still be discharged home this evening with firm understanding of important of medication adherence, follow-up care, and counseling on when to call or present for care. Per Dr. Dalbert GarnetBeasley, PO labetalol increased to 600mg  BID and new script sent to the pharmacy.   Genia DelMargaret Odaly Peri [name] 01/26/2017 8:49 PM

## 2017-01-27 LAB — SURGICAL PATHOLOGY

## 2017-02-03 NOTE — Progress Notes (Signed)
02/03/17: Angelita InglesVanessa Rochelle called this Clinical Social Worker (CSW) today 02/03/17 and asked about housing resources. Per Erie NoeVanessa she has current housing with a friend however it is only temporary and she is trying to find permanent housing. Per Erie NoeVanessa she has applied to [place] [facility] and [place] [facility] however she has been denied due to her criminal background. CSW gave Erie NoeVanessa information on the homeless shelter and she stated she would have other options prior to that. CSW also gave patient information for [facility] of [address]. Erie NoeVanessa thanked CSW for providing resources.   [employer]Bailey Torrie Namba, LCSW [phone]

## 2018-02-02 NOTE — L&D Delivery Note (Signed)
Delivery Note At 7:30 PM a viable and healthy female "[name]" was delivered via Vaginal, Spontaneous (Presentation:OA  ).  APGAR: 8, 9; weight  pending.   Placenta status: intact .  Cord:3VC  with the following complications: none.  Cord pH: pending  Anesthesia:  epidural Episiotomy: None Lacerations:  Bilateral periurethral, hemostatic and small Suture Repair: n/a Est. Blood Loss (mL):  150  Mom to postpartum.  Baby to Couplet care / Skin to Skin.   33yo [id number] at [redacted]w[redacted]d presented for iol for cHTN and fetal growth restriction <3%. Labs normal, blood pressure in mild range but for two severe range taken during contractions but asx and normalized promptly. She received cytotec and FB. SROM for clear fluid, rapid progression to fully dilated. However, prior to delivery she began having deep late decels which progressively worsened. She dilated quickly and pushed over an intact perineum through two contractions and delivered an immediately vigorous baby. Delayed cord clamping for >60 secs while warming and stimulation, and then placed on maternal perineum. Placenta delivered spontaneously and was intact. 3VC noted. Fundus firm, postpartum pitocin running. Minimal blood loss: QBL pending.   Benjaman Kindler 09/10/2018, 7:44 PM

## 2018-05-27 ENCOUNTER — Other Ambulatory Visit: Payer: Self-pay

## 2018-05-27 ENCOUNTER — Emergency Department
Admission: EM | Admit: 2018-05-27 | Discharge: 2018-05-27 | Disposition: A | Discharge status: Home / Self Care | Payer: Medicaid Other | Attending: Emergency Medicine | Admitting: Emergency Medicine

## 2018-05-27 DIAGNOSIS — I159 Secondary hypertension, unspecified: Secondary | ICD-10-CM

## 2018-05-27 DIAGNOSIS — J069 Acute upper respiratory infection, unspecified: Secondary | ICD-10-CM | POA: Diagnosis not present

## 2018-05-27 DIAGNOSIS — B9789 Other viral agents as the cause of diseases classified elsewhere: Secondary | ICD-10-CM

## 2018-05-27 DIAGNOSIS — Z79899 Other long term (current) drug therapy: Secondary | ICD-10-CM | POA: Insufficient documentation

## 2018-05-27 DIAGNOSIS — Z20828 Contact with and (suspected) exposure to other viral communicable diseases: Secondary | ICD-10-CM | POA: Diagnosis not present

## 2018-05-27 DIAGNOSIS — I10 Essential (primary) hypertension: Secondary | ICD-10-CM | POA: Diagnosis not present

## 2018-05-27 DIAGNOSIS — Z87891 Personal history of nicotine dependence: Secondary | ICD-10-CM | POA: Diagnosis not present

## 2018-05-27 DIAGNOSIS — R05 Cough: Secondary | ICD-10-CM | POA: Diagnosis present

## 2018-05-27 LAB — COMPREHENSIVE METABOLIC PANEL
ALT: 13 U/L (ref 0–44)
AST: 20 U/L (ref 15–41)
Albumin: 2.8 g/dL — ABNORMAL LOW (ref 3.5–5.0)
Alkaline Phosphatase: 63 U/L (ref 38–126)
Anion gap: 11 (ref 5–15)
BUN: <5 mg/dL — ABNORMAL LOW (ref 6–20)
CO2: 21 mmol/L — ABNORMAL LOW (ref 22–32)
Calcium: 8.7 mg/dL — ABNORMAL LOW (ref 8.9–10.3)
Chloride: 105 mmol/L (ref 98–111)
Creatinine, Ser: 0.59 mg/dL (ref 0.44–1.00)
GFR calc Af Amer: >60 mL/min (ref >60)
GFR calc non Af Amer: >60 mL/min (ref >60)
Glucose, Bld: 115 mg/dL — ABNORMAL HIGH (ref 70–99)
Potassium: 3.2 mmol/L — ABNORMAL LOW (ref 3.5–5.1)
Sodium: 137 mmol/L (ref 135–145)
Total Bilirubin: 0.2 mg/dL — ABNORMAL LOW (ref 0.3–1.2)
Total Protein: 6.6 g/dL (ref 6.5–8.1)

## 2018-05-27 LAB — URINALYSIS, COMPLETE (UACMP) WITH MICROSCOPIC
Bacteria, UA: NONE SEEN
Bilirubin Urine: NEGATIVE
Glucose, UA: NEGATIVE mg/dL
Hgb urine dipstick: NEGATIVE
Ketones, ur: NEGATIVE mg/dL
Leukocytes,Ua: NEGATIVE
Nitrite: NEGATIVE
Protein, ur: NEGATIVE mg/dL
Specific Gravity, Urine: 1.011 (ref 1.005–1.030)
pH: 7 (ref 5.0–8.0)

## 2018-05-27 LAB — CBC WITH DIFFERENTIAL/PLATELET
Abs Immature Granulocytes: 0.09 10*3/uL — ABNORMAL HIGH (ref 0.00–0.07)
Basophils Absolute: 0 10*3/uL (ref 0.0–0.1)
Basophils Relative: 0 %
Eosinophils Absolute: 0.4 10*3/uL (ref 0.0–0.5)
Eosinophils Relative: 4 %
HCT: 29.6 % — ABNORMAL LOW (ref 36.0–46.0)
Hemoglobin: 9.8 g/dL — ABNORMAL LOW (ref 12.0–15.0)
Immature Granulocytes: 1 %
Lymphocytes Relative: 17 %
Lymphs Abs: 1.9 10*3/uL (ref 0.7–4.0)
MCH: 28.2 pg (ref 26.0–34.0)
MCHC: 33.1 g/dL (ref 30.0–36.0)
MCV: 85.1 fL (ref 80.0–100.0)
Monocytes Absolute: 0.7 10*3/uL (ref 0.1–1.0)
Monocytes Relative: 6 %
Neutro Abs: 7.8 10*3/uL — ABNORMAL HIGH (ref 1.7–7.7)
Neutrophils Relative %: 72 %
Platelets: 199 10*3/uL (ref 150–400)
RBC: 3.48 MIL/uL — ABNORMAL LOW (ref 3.87–5.11)
RDW: 14 % (ref 11.5–15.5)
WBC: 10.9 10*3/uL — ABNORMAL HIGH (ref 4.0–10.5)
nRBC: 0 % (ref 0.0–0.2)

## 2018-05-27 MED ORDER — ALBUTEROL SULFATE HFA 108 (90 BASE) MCG/ACT IN AERS
2.0000 | INHALATION_SPRAY | Freq: Once | RESPIRATORY_TRACT | Status: AC
  Administered 2018-05-27: 15:00:00 2 via RESPIRATORY_TRACT
  Filled 2018-05-27: qty 6.7

## 2018-05-27 MED ORDER — ALBUTEROL SULFATE HFA 108 (90 BASE) MCG/ACT IN AERS: 2.0000 | INHALATION_SPRAY | Freq: Four times a day (QID) | RESPIRATORY_TRACT | 0 refills | Status: DC | PRN

## 2018-05-27 MED ORDER — ALBUTEROL SULFATE (2.5 MG/3ML) 0.083% IN NEBU
2.5000 mg | INHALATION_SOLUTION | Freq: Once | RESPIRATORY_TRACT | Status: DC
Start: 2018-05-27 — End: 2018-05-27
  Filled 2018-05-27: qty 3

## 2018-05-27 MED ORDER — LABETALOL HCL 200 MG PO TABS: 200.0000 mg | ORAL_TABLET | Freq: Two times a day (BID) | ORAL | 1 refills | Status: DC

## 2018-05-27 MED ORDER — PREDNISONE 10 MG (21) PO TBPK: ORAL_TABLET | ORAL | 0 refills | Status: DC

## 2018-05-27 NOTE — ED Provider Notes (Signed)
[facility] Emergency Department Provider Note   ____________________________________________   I have reviewed the triage vital signs and the nursing notes.   HISTORY  Chief Complaint URI   History limited by: Not Limited   HPI Dominique Baker is a 33 y.o. female who presents to the emergency department today because of concerns for cough, wheezing, some shortness of breath.  The patient states that the symptoms have been going on for the past few days.  Her family member has history of asthma so she is been trying their breathing treatments without any significant relief.  She denies any associated chest pain.  Denies any fevers.  She does work at a [employer].  Records reviewed. Per medical record review patient has a history of pre-eclampsia  History reviewed. No pertinent past medical history.  Patient Active Problem List   Diagnosis Date Noted  . Indication for care in labor or delivery 01/23/2017  . Pre-eclampsia, delivered 01/23/2017  . Polysubstance abuse (HCC) 10/09/2016  . Vaginal spotting 10/08/2016  . No prenatal care in current pregnancy in third trimester 10/08/2016    History reviewed. No pertinent surgical history.  Prior to Admission medications   Medication Sig Start Date End Date Taking? Authorizing Provider  labetalol (NORMODYNE) 300 MG tablet Take 2 tablets (600 mg total) by mouth 2 (two) times daily. 01/26/17 04/26/17  Genia Del, [name]  NIFEdipine (PROCARDIA-XL/ADALAT CC) 60 MG 24 hr tablet Take 1 tablet (60 mg total) by mouth daily. 01/27/17 04/27/17  Genia Del, [name]    Allergies Patient has no known allergies.  No family history on file.  Social History Social History   Tobacco Use  . Smoking status: Former Smoker    Packs/day: 0.25    Last attempt to quit: 07/08/2016    Years since quitting: 1.8  . Smokeless tobacco: Never Used  Substance Use Topics  . Alcohol use: No  . Drug use: No     Review of Systems Constitutional: No fever/chills Eyes: No visual changes. ENT: No sore throat. Cardiovascular: Denies chest pain. Respiratory: Positive for shortness of breath, wheezing.  Gastrointestinal: No abdominal pain.  No nausea, no vomiting.  No diarrhea.   Genitourinary: Negative for dysuria. Musculoskeletal: Negative for back pain. Skin: Negative for rash. Neurological: Negative for headaches, focal weakness or numbness.  ____________________________________________   PHYSICAL EXAM:  VITAL SIGNS: ED Triage Vitals [05/27/18 1434]  Enc Vitals Group     BP (!) 156/93     Pulse Rate 85     Resp 16     Temp 98.7 F (37.1 C)     Temp Source Oral     SpO2 100 %   Constitutional: Alert and oriented.  Eyes: Conjunctivae are normal.  ENT      Head: Normocephalic and atraumatic.      Nose: No congestion/rhinnorhea.      Mouth/Throat: Mucous membranes are moist.      Neck: No stridor. Hematological/Lymphatic/Immunilogical: No cervical lymphadenopathy. Cardiovascular: Normal rate, regular rhythm.  No murmurs, rubs, or gallops.  Respiratory: Normal respiratory effort without tachypnea nor retractions. Mild diffuse expiratory wheezing.  Gastrointestinal: Soft and non tender. No rebound. No guarding.  Genitourinary: Deferred Musculoskeletal: Normal range of motion in all extremities. No lower extremity edema. Neurologic:  Normal speech and language. No gross focal neurologic deficits are appreciated.  Skin:  Skin is warm, dry and intact. No rash noted. Psychiatric: Mood and affect are normal. Speech and behavior are normal. Patient exhibits appropriate insight and  judgment.  ____________________________________________    LABS (pertinent positives/negatives)  CMP na 137, k 3.2, glu 115, cr 0.59 UA clear, 0-5 rbc and wbc CBC wbc 10.9, hgb 9.8, plt 199  ____________________________________________   EKG  None  ____________________________________________     RADIOLOGY  None  ____________________________________________   PROCEDURES  Procedures  ____________________________________________   INITIAL IMPRESSION / ASSESSMENT AND PLAN / ED COURSE  Pertinent labs & imaging results that were available during my care of the patient were reviewed by me and considered in my medical decision making (see chart for details).   Patient presented to the emergency department today with some shortness of breath and wheezing.  No fevers.  On exam she had diffuse expiratory wheeze but no focal auscultatory abnormalities.  She did feel somewhat better after inhaler.  At this point I think reactive airway disease likely.  Blood work without significant leukocytosis.  At this point I doubt pneumonia, doubt pneumonia will plan on discharging with albuterol and steroids. Will send covid test.     ____________________________________________   FINAL CLINICAL IMPRESSION(S) / ED DIAGNOSES  Final diagnoses:  Viral URI with cough  Secondary hypertension     Note: This dictation was prepared with Dragon dictation. Any transcriptional errors that result from this process are unintentional     Phineas SemenGoodman, Nesiah Jump, MD 05/28/18 [phone]

## 2018-05-27 NOTE — ED Triage Notes (Signed)
Pt c/o cough, sore throat with SOB since Monday and has been taking OTC meds with no relief.

## 2018-05-27 NOTE — ED Notes (Signed)
Pt verbalized understanding of discharge instructions. NAD at this time. 

## 2018-05-27 NOTE — Discharge Instructions (Signed)
Please seek medical attention for any high fevers, chest pain, shortness of breath, change in behavior, persistent vomiting, bloody stool or any other new or concerning symptoms.  

## 2018-05-28 LAB — NOVEL CORONAVIRUS, NAA (HOSP ORDER, SEND-OUT TO REF LAB; TAT 18-24 HRS): SARS-CoV-2, NAA: NOT DETECTED

## 2018-05-31 DIAGNOSIS — O0992 Supervision of high risk pregnancy, unspecified, second trimester: Secondary | ICD-10-CM | POA: Diagnosis not present

## 2018-05-31 DIAGNOSIS — R030 Elevated blood-pressure reading, without diagnosis of hypertension: Secondary | ICD-10-CM | POA: Diagnosis not present

## 2018-05-31 DIAGNOSIS — O99210 Obesity complicating pregnancy, unspecified trimester: Secondary | ICD-10-CM | POA: Diagnosis not present

## 2018-05-31 DIAGNOSIS — Z1388 Encounter for screening for disorder due to exposure to contaminants: Secondary | ICD-10-CM | POA: Diagnosis not present

## 2018-05-31 DIAGNOSIS — Z3009 Encounter for other general counseling and advice on contraception: Secondary | ICD-10-CM | POA: Diagnosis not present

## 2018-05-31 DIAGNOSIS — D72829 Elevated white blood cell count, unspecified: Secondary | ICD-10-CM | POA: Diagnosis not present

## 2018-05-31 DIAGNOSIS — O99012 Anemia complicating pregnancy, second trimester: Secondary | ICD-10-CM | POA: Diagnosis not present

## 2018-05-31 DIAGNOSIS — O0990 Supervision of high risk pregnancy, unspecified, unspecified trimester: Secondary | ICD-10-CM | POA: Diagnosis not present

## 2018-05-31 DIAGNOSIS — O169 Unspecified maternal hypertension, unspecified trimester: Secondary | ICD-10-CM | POA: Diagnosis not present

## 2018-05-31 DIAGNOSIS — Z8751 Personal history of pre-term labor: Secondary | ICD-10-CM | POA: Diagnosis not present

## 2018-05-31 DIAGNOSIS — Z8759 Personal history of other complications of pregnancy, childbirth and the puerperium: Secondary | ICD-10-CM | POA: Diagnosis not present

## 2018-05-31 DIAGNOSIS — Z0389 Encounter for observation for other suspected diseases and conditions ruled out: Secondary | ICD-10-CM | POA: Diagnosis not present

## 2018-05-31 DIAGNOSIS — J45909 Unspecified asthma, uncomplicated: Secondary | ICD-10-CM | POA: Diagnosis not present

## 2018-05-31 DIAGNOSIS — D573 Sickle-cell trait: Secondary | ICD-10-CM | POA: Diagnosis not present

## 2018-05-31 LAB — OB RESULTS CONSOLE HIV ANTIBODY (ROUTINE TESTING): HIV: NONREACTIVE

## 2018-06-01 LAB — OB RESULTS CONSOLE HEPATITIS B SURFACE ANTIGEN: Hepatitis B Surface Ag: NEGATIVE

## 2018-06-01 LAB — OB RESULTS CONSOLE RPR: RPR: NONREACTIVE

## 2018-06-03 LAB — OB RESULTS CONSOLE GC/CHLAMYDIA
Chlamydia: NEGATIVE
Gonorrhea: NEGATIVE

## 2018-06-06 ENCOUNTER — Other Ambulatory Visit: Payer: Self-pay | Admitting: Family Medicine

## 2018-06-06 DIAGNOSIS — R030 Elevated blood-pressure reading, without diagnosis of hypertension: Secondary | ICD-10-CM

## 2018-06-06 LAB — OB RESULTS CONSOLE VARICELLA ZOSTER ANTIBODY, IGG: Varicella: IMMUNE

## 2018-06-06 LAB — OB RESULTS CONSOLE RUBELLA ANTIBODY, IGM: Rubella: IMMUNE

## 2018-06-07 ENCOUNTER — Emergency Department
Admission: EM | Admit: 2018-06-07 | Discharge: 2018-06-07 | Disposition: A | Discharge status: Home / Self Care | Payer: Medicaid Other | Attending: Emergency Medicine | Admitting: Emergency Medicine

## 2018-06-07 ENCOUNTER — Other Ambulatory Visit: Payer: Self-pay

## 2018-06-07 ENCOUNTER — Encounter: Payer: Self-pay | Admitting: Emergency Medicine

## 2018-06-07 ENCOUNTER — Emergency Department: Payer: Medicaid Other

## 2018-06-07 DIAGNOSIS — Z79899 Other long term (current) drug therapy: Secondary | ICD-10-CM | POA: Diagnosis not present

## 2018-06-07 DIAGNOSIS — I10 Essential (primary) hypertension: Secondary | ICD-10-CM | POA: Insufficient documentation

## 2018-06-07 DIAGNOSIS — Z3A22 22 weeks gestation of pregnancy: Secondary | ICD-10-CM | POA: Diagnosis not present

## 2018-06-07 DIAGNOSIS — Z87891 Personal history of nicotine dependence: Secondary | ICD-10-CM | POA: Diagnosis not present

## 2018-06-07 DIAGNOSIS — Z20828 Contact with and (suspected) exposure to other viral communicable diseases: Secondary | ICD-10-CM | POA: Insufficient documentation

## 2018-06-07 DIAGNOSIS — O99512 Diseases of the respiratory system complicating pregnancy, second trimester: Secondary | ICD-10-CM | POA: Diagnosis present

## 2018-06-07 DIAGNOSIS — J9801 Acute bronchospasm: Secondary | ICD-10-CM | POA: Diagnosis not present

## 2018-06-07 DIAGNOSIS — Z03818 Encounter for observation for suspected exposure to other biological agents ruled out: Secondary | ICD-10-CM | POA: Diagnosis not present

## 2018-06-07 DIAGNOSIS — R05 Cough: Secondary | ICD-10-CM | POA: Diagnosis not present

## 2018-06-07 DIAGNOSIS — R0602 Shortness of breath: Secondary | ICD-10-CM | POA: Diagnosis not present

## 2018-06-07 HISTORY — DX: Essential (primary) hypertension: I10

## 2018-06-07 LAB — COMPREHENSIVE METABOLIC PANEL
ALT: 13 U/L (ref 0–44)
AST: 17 U/L (ref 15–41)
Albumin: 2.7 g/dL — ABNORMAL LOW (ref 3.5–5.0)
Alkaline Phosphatase: 61 U/L (ref 38–126)
Anion gap: 10 (ref 5–15)
BUN: <5 mg/dL — ABNORMAL LOW (ref 6–20)
CO2: 23 mmol/L (ref 22–32)
Calcium: 8.5 mg/dL — ABNORMAL LOW (ref 8.9–10.3)
Chloride: 106 mmol/L (ref 98–111)
Creatinine, Ser: 0.54 mg/dL (ref 0.44–1.00)
GFR calc Af Amer: >60 mL/min (ref >60)
GFR calc non Af Amer: >60 mL/min (ref >60)
Glucose, Bld: 87 mg/dL (ref 70–99)
Potassium: 3.3 mmol/L — ABNORMAL LOW (ref 3.5–5.1)
Sodium: 139 mmol/L (ref 135–145)
Total Bilirubin: 0.5 mg/dL (ref 0.3–1.2)
Total Protein: 6 g/dL — ABNORMAL LOW (ref 6.5–8.1)

## 2018-06-07 LAB — CBC WITH DIFFERENTIAL/PLATELET
Abs Immature Granulocytes: 0.14 10*3/uL — ABNORMAL HIGH (ref 0.00–0.07)
Basophils Absolute: 0 10*3/uL (ref 0.0–0.1)
Basophils Relative: 0 %
Eosinophils Absolute: 0.4 10*3/uL (ref 0.0–0.5)
Eosinophils Relative: 4 %
HCT: 27.6 % — ABNORMAL LOW (ref 36.0–46.0)
Hemoglobin: 9.1 g/dL — ABNORMAL LOW (ref 12.0–15.0)
Immature Granulocytes: 1 %
Lymphocytes Relative: 15 %
Lymphs Abs: 1.5 10*3/uL (ref 0.7–4.0)
MCH: 28.2 pg (ref 26.0–34.0)
MCHC: 33 g/dL (ref 30.0–36.0)
MCV: 85.4 fL (ref 80.0–100.0)
Monocytes Absolute: 0.7 10*3/uL (ref 0.1–1.0)
Monocytes Relative: 7 %
Neutro Abs: 7.3 10*3/uL (ref 1.7–7.7)
Neutrophils Relative %: 73 %
Platelets: 191 10*3/uL (ref 150–400)
RBC: 3.23 MIL/uL — ABNORMAL LOW (ref 3.87–5.11)
RDW: 14.9 % (ref 11.5–15.5)
WBC: 10 10*3/uL (ref 4.0–10.5)
nRBC: 0 % (ref 0.0–0.2)

## 2018-06-07 LAB — SARS CORONAVIRUS 2 BY RT PCR (HOSPITAL ORDER, PERFORMED IN ~~LOC~~ HOSPITAL LAB): SARS Coronavirus 2: NEGATIVE

## 2018-06-07 MED ORDER — ALBUTEROL SULFATE (2.5 MG/3ML) 0.083% IN NEBU
2.5000 mg | INHALATION_SOLUTION | Freq: Once | RESPIRATORY_TRACT | Status: AC
  Administered 2018-06-07: 2.5 mg via RESPIRATORY_TRACT
  Filled 2018-06-07: qty 3

## 2018-06-07 MED ORDER — PREDNISONE 20 MG PO TABS
60.0000 mg | ORAL_TABLET | Freq: Once | ORAL | Status: AC
  Administered 2018-06-07: 60 mg via ORAL
  Filled 2018-06-07: qty 3

## 2018-06-07 MED ORDER — PREDNISONE 50 MG PO TABS: 50.0000 mg | ORAL_TABLET | Freq: Every day | ORAL | 0 refills | Status: DC

## 2018-06-07 NOTE — ED Triage Notes (Signed)
Pt was seen here 4/24 and dx with viral illness, neg covid, pt c/o worsening sx of cough, congestion , sore throat, wheezing. Pt states she is about [redacted] weeks pregnant. Pt has audible wheezing.

## 2018-06-07 NOTE — ED Notes (Signed)
Pt sleeping, easily aroused. Breathing equal and unlabored. Will continue to assess.

## 2018-06-07 NOTE — ED Notes (Signed)
Pt continues to rest. No complaints at this time. Call bell in reach. Will continue to assess.

## 2018-06-07 NOTE — ED Triage Notes (Signed)
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 Pt states cough is productive with yellow sputum.

## 2018-06-07 NOTE — ED Provider Notes (Signed)
[facility] Emergency Department Provider Note   ____________________________________________    I have reviewed the triage vital signs and the nursing notes.   HISTORY  Chief Complaint Cough and Shortness of Breath     HPI Dominique InglesVanessa Mells is a 33 y.o. female who is [redacted] weeks pregnant who presents with complaints of cough, shortness of breath, wheezing.  Patient was seen here 2 weeks ago for similar complaints.  Improved after course of steroids but reports symptoms returned 2 days ago.  She does work in a assisted living facility.  Denies fevers chills or myalgias.  Noticed that she was short of breath today with exertion.  No chest pain.  Review of medical records demonstrates a history of preeclampsia during last pregnancy.  Has not been able to see PCP.  Past Medical History:  Diagnosis Date  . Hypertension     Patient Active Problem List   Diagnosis Date Noted  . Indication for care in labor or delivery 01/23/2017  . Pre-eclampsia, delivered 01/23/2017  . Polysubstance abuse (HCC) 10/09/2016  . Vaginal spotting 10/08/2016  . No prenatal care in current pregnancy in third trimester 10/08/2016    History reviewed. No pertinent surgical history.  Prior to Admission medications   Medication Sig Start Date End Date Taking? Authorizing Provider  labetalol (NORMODYNE) 200 MG tablet Take 1 tablet (200 mg total) by mouth 2 (two) times daily. 05/27/18  Yes Phineas SemenGoodman, Graydon, MD  albuterol (VENTOLIN HFA) 108 (90 Base) MCG/ACT inhaler Inhale 2 puffs into the lungs every 6 (six) hours as needed for wheezing or shortness of breath. 05/27/18   Phineas SemenGoodman, Graydon, MD  labetalol (NORMODYNE) 300 MG tablet Take 2 tablets (600 mg total) by mouth 2 (two) times daily. 01/26/17 04/26/17  Genia DelHaviland, Margaret, [name]  NIFEdipine (PROCARDIA-XL/ADALAT CC) 60 MG 24 hr tablet Take 1 tablet (60 mg total) by mouth daily. 01/27/17 04/27/17  Genia DelHaviland, Margaret, [name]  predniSONE  (DELTASONE) 50 MG tablet Take 1 tablet (50 mg total) by mouth daily with breakfast. 06/07/18   Jene EveryKinner, Ercilia Bettinger, MD     Allergies Patient has no known allergies.  No family history on file.  Social History Social History   Tobacco Use  . Smoking status: Former Smoker    Packs/day: 0.25    Last attempt to quit: 07/08/2016    Years since quitting: 1.9  . Smokeless tobacco: Never Used  Substance Use Topics  . Alcohol use: No  . Drug use: No    Review of Systems  Constitutional: No fever/chills Eyes: No visual changes.  ENT: No sore throat. Cardiovascular: Denies chest pain. Respiratory: As above Gastrointestinal: No abdominal pain.  No nausea, no vomiting.   Genitourinary: Negative for dysuria. Musculoskeletal: No myalgias Skin: Negative for rash. Neurological: Negative for headaches   ____________________________________________   PHYSICAL EXAM:  VITAL SIGNS: ED Triage Vitals  Enc Vitals Group     BP 06/07/18 0813 (!) 176/116     Pulse Rate 06/07/18 0813 96     Resp 06/07/18 0813 20     Temp 06/07/18 0816 98.3 F (36.8 C)     Temp Source 06/07/18 0816 Oral     SpO2 06/07/18 0813 100 %     Weight 06/07/18 0814 119.7 kg (264 lb)     Height 06/07/18 0814 1.676 m (5\' 6" )     Head Circumference --      Peak Flow --      Pain Score 06/07/18 0814 7  Pain Loc --      Pain Edu? --      Excl. in GC? --     Constitutional: Alert and oriented.  Eyes: Conjunctivae are normal.   Nose: No congestion/rhinnorhea. Mouth/Throat: Mucous membranes are moist.    Cardiovascular: Normal rate, regular rhythm. Grossly normal heart sounds.  Good peripheral circulation. Respiratory: Normal respiratory effort.  No retractions.  Scattered wheezes Gastrointestinal: Soft and nontender. No distention.  No CVA tenderness.  Musculoskeletal: No significant edema.  Warm and well perfused Neurologic:  Normal speech and language. No gross focal neurologic deficits are appreciated.  Skin:   Skin is warm, dry and intact. No rash noted. Psychiatric: Mood and affect are normal. Speech and behavior are normal.  ____________________________________________   LABS (all labs ordered are listed, but only abnormal results are displayed)  Labs Reviewed  CBC WITH DIFFERENTIAL/PLATELET - Abnormal; Notable for the following components:      Result Value   RBC 3.23 (*)    Hemoglobin 9.1 (*)    HCT 27.6 (*)    Abs Immature Granulocytes 0.14 (*)    All other components within normal limits  COMPREHENSIVE METABOLIC PANEL - Abnormal; Notable for the following components:   Potassium 3.3 (*)    BUN <5 (*)    Calcium 8.5 (*)    Total Protein 6.0 (*)    Albumin 2.7 (*)    All other components within normal limits  SARS CORONAVIRUS 2 (HOSPITAL ORDER, PERFORMED IN [facility])   ____________________________________________  EKG  None ____________________________________________  RADIOLOGY  Chest x-ray ____________________________________________   PROCEDURES  Procedure(s) performed: No  Procedures   Critical Care performed: No ____________________________________________   INITIAL IMPRESSION / ASSESSMENT AND PLAN / ED COURSE  Pertinent labs & imaging results that were available during my care of the patient were reviewed by me and considered in my medical decision making (see chart for details).  Patient presents with cough, shortness of breath, wheezing.  Differential includes bronchospasm/bronchitis, asthma, patient does not smoke.  COVID-19 is also on the differential as is pulmonary edema.  Pending chest x-ray, labs, we will retest with rapid COVID test given her work environment.  Blood pressure is also elevated she reports compliance with blood pressure medications  Patient improved after nebulizer, will give short course of steroids as well which significantly helped her last time.  Lab work is overall reassuring, she is mildly anemic which is  unchanged for her.  COVID test is negative.  Strongly encourage close follow-up with GYN for further blood pressure control     Dominique Baker was evaluated in Emergency Department on 06/07/2018 for the symptoms described in the history of present illness. She was evaluated in the context of the global COVID-19 pandemic, which necessitated consideration that the patient might be at risk for infection with the SARS-CoV-2 virus that causes COVID-19. Institutional protocols and algorithms that pertain to the evaluation of patients at risk for COVID-19 are in a state of rapid change based on information released by regulatory bodies including the CDC and federal and state organizations. These policies and algorithms were followed during the patient's care in the ED.     ____________________________________________   FINAL CLINICAL IMPRESSION(S) / ED DIAGNOSES  Final diagnoses:  Bronchospasm        Note:  This document was prepared using Dragon voice recognition software and may include unintentional dictation errors.   Jene Every, MD 06/07/18 1224

## 2018-06-07 NOTE — ED Notes (Signed)
Pt verbalizes understanding of medications and follow up. 

## 2018-06-07 NOTE — ED Triage Notes (Signed)
Pt reports was seen here 2 weeks ago and given meds for HTN, and an upper respiratory infection. Pt reports she was feeling better and then over the past day she has started to feel worse. Pt reports cough and SOB and states that she was tested for COVID and it came back negative. Pt denies fevers.  Pt reports did not follow up because she did not her PMD had kicked her out of the practice for making appointments and not showing up.

## 2018-06-13 ENCOUNTER — Other Ambulatory Visit: Payer: Self-pay

## 2018-06-13 DIAGNOSIS — O132 Gestational [pregnancy-induced] hypertension without significant proteinuria, second trimester: Secondary | ICD-10-CM

## 2018-06-16 ENCOUNTER — Telehealth: Payer: Self-pay

## 2018-06-16 ENCOUNTER — Ambulatory Visit (HOSPITAL_BASED_OUTPATIENT_CLINIC_OR_DEPARTMENT_OTHER)
Admission: RE | Admit: 2018-06-16 | Discharge: 2018-06-16 | Disposition: A | Discharge status: Home / Self Care | Payer: Medicaid Other | Source: Ambulatory Visit | Attending: Family Medicine | Admitting: Family Medicine

## 2018-06-16 ENCOUNTER — Other Ambulatory Visit: Payer: Self-pay | Admitting: Obstetrics and Gynecology

## 2018-06-16 ENCOUNTER — Ambulatory Visit
Admission: RE | Admit: 2018-06-16 | Discharge: 2018-06-16 | Disposition: A | Discharge status: Home / Self Care | Payer: Medicaid Other | Source: Ambulatory Visit | Attending: Obstetrics and Gynecology | Admitting: Obstetrics and Gynecology

## 2018-06-16 ENCOUNTER — Other Ambulatory Visit: Payer: Self-pay

## 2018-06-16 DIAGNOSIS — Z832 Family history of diseases of the blood and blood-forming organs and certain disorders involving the immune mechanism: Secondary | ICD-10-CM | POA: Diagnosis not present

## 2018-06-16 DIAGNOSIS — E669 Obesity, unspecified: Secondary | ICD-10-CM | POA: Diagnosis not present

## 2018-06-16 DIAGNOSIS — Z3A24 24 weeks gestation of pregnancy: Secondary | ICD-10-CM | POA: Insufficient documentation

## 2018-06-16 DIAGNOSIS — O163 Unspecified maternal hypertension, third trimester: Secondary | ICD-10-CM | POA: Diagnosis not present

## 2018-06-16 DIAGNOSIS — Z87891 Personal history of nicotine dependence: Secondary | ICD-10-CM | POA: Insufficient documentation

## 2018-06-16 DIAGNOSIS — O1494 Unspecified pre-eclampsia, complicating childbirth: Secondary | ICD-10-CM | POA: Diagnosis present

## 2018-06-16 DIAGNOSIS — D573 Sickle-cell trait: Secondary | ICD-10-CM | POA: Insufficient documentation

## 2018-06-16 DIAGNOSIS — O99019 Anemia complicating pregnancy, unspecified trimester: Secondary | ICD-10-CM

## 2018-06-16 DIAGNOSIS — Z8751 Personal history of pre-term labor: Secondary | ICD-10-CM | POA: Insufficient documentation

## 2018-06-16 DIAGNOSIS — J45901 Unspecified asthma with (acute) exacerbation: Secondary | ICD-10-CM | POA: Diagnosis not present

## 2018-06-16 DIAGNOSIS — Z833 Family history of diabetes mellitus: Secondary | ICD-10-CM | POA: Diagnosis not present

## 2018-06-16 DIAGNOSIS — O321XX0 Maternal care for breech presentation, not applicable or unspecified: Secondary | ICD-10-CM | POA: Insufficient documentation

## 2018-06-16 DIAGNOSIS — O99212 Obesity complicating pregnancy, second trimester: Secondary | ICD-10-CM | POA: Insufficient documentation

## 2018-06-16 DIAGNOSIS — O26892 Other specified pregnancy related conditions, second trimester: Secondary | ICD-10-CM | POA: Insufficient documentation

## 2018-06-16 DIAGNOSIS — Z8249 Family history of ischemic heart disease and other diseases of the circulatory system: Secondary | ICD-10-CM | POA: Insufficient documentation

## 2018-06-16 DIAGNOSIS — O10919 Unspecified pre-existing hypertension complicating pregnancy, unspecified trimester: Secondary | ICD-10-CM | POA: Diagnosis present

## 2018-06-16 DIAGNOSIS — O99012 Anemia complicating pregnancy, second trimester: Secondary | ICD-10-CM | POA: Diagnosis not present

## 2018-06-16 DIAGNOSIS — R062 Wheezing: Secondary | ICD-10-CM | POA: Insufficient documentation

## 2018-06-16 DIAGNOSIS — Z79899 Other long term (current) drug therapy: Secondary | ICD-10-CM | POA: Diagnosis not present

## 2018-06-16 DIAGNOSIS — O132 Gestational [pregnancy-induced] hypertension without significant proteinuria, second trimester: Secondary | ICD-10-CM

## 2018-06-16 DIAGNOSIS — F1910 Other psychoactive substance abuse, uncomplicated: Secondary | ICD-10-CM

## 2018-06-16 MED ORDER — PREDNISONE 10 MG (21) PO TBPK: ORAL_TABLET | ORAL | 0 refills | Status: DC

## 2018-06-16 MED ORDER — ASPIRIN 81 MG PO CHEW: 81.0000 mg | CHEWABLE_TABLET | Freq: Every day | ORAL | 11 refills | Status: DC

## 2018-06-16 MED ORDER — LABETALOL HCL 100 MG PO TABS: 200.0000 mg | ORAL_TABLET | Freq: Three times a day (TID) | ORAL | 3 refills | Status: DC

## 2018-06-16 MED ORDER — BECLOMETHASONE DIPROP HFA 40 MCG/ACT IN AERB: 2.0000 | INHALATION_SPRAY | Freq: Two times a day (BID) | RESPIRATORY_TRACT | 3 refills | Status: DC

## 2018-06-16 NOTE — Progress Notes (Signed)
Med orders placed  Jimmey Ralph

## 2018-06-16 NOTE — Progress Notes (Signed)
[facility] Consultation   Chief Complaint: Hypertension, preterm birth, sickle trait  in pregnancy   HPI: [name] is a 33 y.o. 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  She was seen in the ER last week for wheezing and was given a prednisone taper pack and an inhaler.  She states she can feels getting worse again now that she is at the end of her prednisone pack.  Chest x-ray in the ER on 06/07/2018 was negative.  Her son has asthma and she is used his nebulizer.  She does not think this is related to labetalol she is used it for multiple months after the birth of her last child.  She has no history of aspirin sensitivity. The cough has not been productive mostly white sputum if she coughs very deeply she can experience a tiny blood streak. She discussed the stress she has experienced during this pregnancy.  Her partner is not supportive of her keeping the pregnancy.  She pursued termination but was too far along.  He has suggested adoption to her but she has thinks that adoption is not going to be acceptable to her.  His lack of support, working in a nursing home during COVID pandemic, and caring for her 4 children has created a great deal of stress in her life.  Patient states that she never required blood pressure medicine or had serious blood pressure issues until her last delivery.  During her hospitalization she was started on Procardia and labetalol and required magnesium therapy.  She continue the labetalol until February. According to her chart she no longer smokes or drinks.  We discussed contraception.  She is interested in a tubal ligation.  I suggested to her that given her size that a interval laparoscopic procedure might be best.  I reviewed  option of Larc contraception including Nexplanon or IUD.  Past Medical History: Patient  has a past medical history of Hypertension.  Past Surgical History: She  has no past surgical history on file.  Obstetric History:  OB History    Gravida  7   Para  3   Term  1   Preterm  2   AB  2   Living  4     SAB      TAB      Ectopic      Multiple      Live Births             1 -2002  elective tab 6 weeks  in [place] as a teen 2 2005 term SVD 5 lbs 3 oz female epidural  3 2006 "5-6 mos" SVD 2 lbs female noted pressure went to hospital immediately delivered - son hospitalized for 4 months seizure d/o  4 2009 - "7-8 months" SVD [place]  Female 4 lbs hospitalized for 6 weeks  5 2018 37 weeks SVD 2460 g [facility] girl preex requiring multiple meds for BP and mag limited prenatal care   Gynecologic History:  Patient's last menstrual period was 12/25/2017.    Medications:  Current Outpatient Medications:  .  albuterol (VENTOLIN HFA) 108 (90 Base) MCG/ACT inhaler, Inhale 2 puffs into the lungs every 6 (six) hours as needed for wheezing or shortness  of breath., Disp: 1 Inhaler, Rfl: 0 .  labetalol (NORMODYNE) 200 MG tablet, Take 1 tablet (200 mg total) by mouth 2 (two) times daily., Disp: 60 tablet, Rfl: 1 Allergies: Patient has No Known Allergies.  Social History: Patient  reports that she quit smoking about 23 months ago. She smoked 0.25 packs per day. She has never used smokeless tobacco. She reports that she does not drink alcohol or use drugs.  Family History: family history includes Diabetes in her mother; Hypertension in her mother; Sickle cell anemia in her mother; Sickle cell trait in her brother, brother, brother, daughter, sister, sister, sister, sister, sister, son, son, and son.  Review of Systems A full 12 point review of systems was negative or as noted in the History of Present Illness.  Physical Exam: 66 inches BMI 41 271 lbs 141/96 T98.4 Hr 100 O2 sat 97%  rr18  LMP  12/25/2017  Obese pleasant African-American female Chest diffuse expiratory wheezes patient left her inhaler in the car Ultrasound was done today EFW at the 16th percentile  Asessement:  IUP at 24 5/7 1 chronic hypertension in pregnancy on meds ,  h/o preeclampsia in prior pregnancy- normal baseline labs  2 h/o preterm birth 2nd and third babies  3 obesity in pregnancy- BMI 41 - baseline labs done at [facility]  4 sickle cell trait - as do her children , h/o UTIs  5 anemia - pica noted at [facility]  6 new onset wheezing negative COVID test no prior history of asthma based on history doubt related to labetalol use no history of congestive heart failure Plan:  1. Increase labetalol to 200 -3 times daily have pt check BP at work  2. start baby aspirin   3- monthly growth scan  4 q 2week OB visit to track BP  5 Low threshold to come to the hospital for labor sxs  6- dip urines at each visit , monthly urine culture for Sickle trait  7 third trimester labs at health dept  8 given her new onset wheezing I recommended to the patient that we obtain an echocardiogram with obesity pregnancy and hyper hypertension she is at risk for congestive heart failure.  I also recommended she make contact with pulmonologist given the routine interventions have not improved her wheezing, if her wheezing is worsened by the increase in her labetalol I told her we could switch to Procardia once daily she is mildly tachycardic Procardia may make that worse.  Ordered the following medications" Labetalol 200 3 times daily Qvar inhaler 2 puffs twice daily Prednisone 10 mg #21 taper pack Aspirin 81 mg daily  I ordered the following procedures Echocardiogram maternal Pulmonary consult for new onset wheezing Follow-up OB ultrasound in 4 weeks Follow-up maternal-fetal medicine consult in 2 weeks to assess her wheezing and her blood pressure  I told the patient she would need to go the health department to get her third  trimester labs her Tdap and sign tubal papers      Total time spent with the patient was 30 minutes with greater than 50% spent in counseling and coordination of care. We appreciate this interesting consult and will be happy to be involved in the ongoing care of [name] in anyway her obstetricians desire.  Jimmey RalphLivingston, Berkley Wrightsman, MD Maternal-Fetal Medicine [facility]

## 2018-06-20 ENCOUNTER — Telehealth: Payer: Self-pay

## 2018-06-20 DIAGNOSIS — I10 Essential (primary) hypertension: Secondary | ICD-10-CM | POA: Diagnosis not present

## 2018-06-20 DIAGNOSIS — J4530 Mild persistent asthma, uncomplicated: Secondary | ICD-10-CM | POA: Diagnosis not present

## 2018-06-20 DIAGNOSIS — D573 Sickle-cell trait: Secondary | ICD-10-CM | POA: Diagnosis not present

## 2018-06-20 DIAGNOSIS — F411 Generalized anxiety disorder: Secondary | ICD-10-CM | POA: Diagnosis not present

## 2018-06-30 ENCOUNTER — Other Ambulatory Visit: Payer: Self-pay

## 2018-06-30 ENCOUNTER — Ambulatory Visit
Admission: RE | Admit: 2018-06-30 | Discharge: 2018-06-30 | Disposition: A | Discharge status: Home / Self Care | Payer: Medicaid Other | Source: Ambulatory Visit | Attending: Maternal & Fetal Medicine | Admitting: Maternal & Fetal Medicine

## 2018-06-30 VITALS — BP 143/78 | HR 88 | Temp 98.1°F | Resp 18 | Ht 66.0 in | Wt 277.5 lb

## 2018-06-30 DIAGNOSIS — J45901 Unspecified asthma with (acute) exacerbation: Secondary | ICD-10-CM

## 2018-06-30 DIAGNOSIS — O10919 Unspecified pre-existing hypertension complicating pregnancy, unspecified trimester: Secondary | ICD-10-CM

## 2018-06-30 NOTE — Progress Notes (Signed)
[facility]  Follow upConsultation    HPI: [name] is a 33 y.o. [id number] at [redacted]w[redacted]d by who presents in consultation from  for asthma and cHTN. Her history is also significant for sickle cell trait, obesity and prior prterm birth At the time of her last visit she was noted to have wheezing and bp elevation. She was given a prescription for long-acting inhaled steroid inhaler and  Established care with a primary care provider. She reports feeling better today but is worried may have asthma exacerbation when stops steroid taper. Of note, she tested neg x 2 for covid. She was recommended to have maternal echo, as well.   Ob hx reviewed from previous note: 1 -2002  elective tab 6 weeks  in [place] as a teen 2 2005 term SVD 5 lbs 3 oz female epidural  3 2006 "5-6 mos" SVD 2 lbs female noted pressure went to hospital immediately delivered - son hospitalized for 4 months seizure d/o  4 2009 - "7-8 months" SVD [place]  Female 4 lbs hospitalized for 6 weeks  5 2018 37 weeks SVD 2460 g [facility] girl preex requiring multiple meds for BP and mag limited prenatal care   Physical Exam: BP (!) 143/78 (BP Location: Other (Comment)) Comment (BP Location): Right forearm  Pulse 88   Temp 98.1 F (36.7 C) (Oral)   Resp 18   Ht 5\' 6"  (1.676 m)   Wt 125.9 kg   LMP 12/25/2017   SpO2 97%   BMI 44.79 kg/m    Lungs clear RRR no mgr FHR 150s  Active Issues 1 chronic hypertension/obesity --cont labetalol 200mg  tid, baby aspirin for preE risk reduction Scheduled for maternal echo Has follow up growth scheduled h/o preeclampsia in prior pregnancy- normal baseline labs  2. Asthma--cont long acting steroid inhaler, cont prednisone taper, was given asmanex by primary provider 3. Prior PTB--aware of ptl sxs and will report to hospital if onset   Total time spent with the patient was 15 minutes with greater than 50% spent in counseling and coordination of care. We appreciate this interesting consult  and will be happy to be involved in the ongoing care of Dominique Baker in anyway her obstetricians desire.  Dominique Pandy, MD Maternal-Fetal Medicine [facility]

## 2018-07-11 ENCOUNTER — Other Ambulatory Visit: Payer: Self-pay

## 2018-07-11 DIAGNOSIS — O10919 Unspecified pre-existing hypertension complicating pregnancy, unspecified trimester: Secondary | ICD-10-CM

## 2018-07-14 ENCOUNTER — Ambulatory Visit
Admission: RE | Admit: 2018-07-14 | Discharge: 2018-07-14 | Disposition: A | Discharge status: Home / Self Care | Payer: Medicaid Other | Source: Ambulatory Visit | Attending: Obstetrics and Gynecology | Admitting: Obstetrics and Gynecology

## 2018-07-14 ENCOUNTER — Other Ambulatory Visit: Payer: Self-pay

## 2018-07-14 DIAGNOSIS — E669 Obesity, unspecified: Secondary | ICD-10-CM | POA: Insufficient documentation

## 2018-07-14 DIAGNOSIS — O162 Unspecified maternal hypertension, second trimester: Secondary | ICD-10-CM | POA: Diagnosis present

## 2018-07-14 DIAGNOSIS — D573 Sickle-cell trait: Secondary | ICD-10-CM | POA: Diagnosis not present

## 2018-07-14 DIAGNOSIS — O99213 Obesity complicating pregnancy, third trimester: Secondary | ICD-10-CM | POA: Insufficient documentation

## 2018-07-14 DIAGNOSIS — Z3A28 28 weeks gestation of pregnancy: Secondary | ICD-10-CM | POA: Diagnosis not present

## 2018-07-14 DIAGNOSIS — O99013 Anemia complicating pregnancy, third trimester: Secondary | ICD-10-CM | POA: Diagnosis not present

## 2018-07-14 DIAGNOSIS — O10919 Unspecified pre-existing hypertension complicating pregnancy, unspecified trimester: Secondary | ICD-10-CM

## 2018-07-14 DIAGNOSIS — O163 Unspecified maternal hypertension, third trimester: Secondary | ICD-10-CM | POA: Diagnosis not present

## 2018-08-08 ENCOUNTER — Other Ambulatory Visit: Payer: Self-pay

## 2018-08-08 DIAGNOSIS — O10919 Unspecified pre-existing hypertension complicating pregnancy, unspecified trimester: Secondary | ICD-10-CM

## 2018-08-11 ENCOUNTER — Other Ambulatory Visit: Payer: Self-pay

## 2018-08-11 ENCOUNTER — Ambulatory Visit
Admission: RE | Admit: 2018-08-11 | Discharge: 2018-08-11 | Disposition: A | Discharge status: Home / Self Care | Payer: Medicaid Other | Source: Ambulatory Visit | Attending: Maternal & Fetal Medicine | Admitting: Maternal & Fetal Medicine

## 2018-08-11 ENCOUNTER — Other Ambulatory Visit: Payer: Self-pay | Admitting: Maternal & Fetal Medicine

## 2018-08-11 DIAGNOSIS — O10013 Pre-existing essential hypertension complicating pregnancy, third trimester: Secondary | ICD-10-CM | POA: Insufficient documentation

## 2018-08-11 DIAGNOSIS — O10919 Unspecified pre-existing hypertension complicating pregnancy, unspecified trimester: Secondary | ICD-10-CM

## 2018-08-11 DIAGNOSIS — Z3A32 32 weeks gestation of pregnancy: Secondary | ICD-10-CM | POA: Diagnosis not present

## 2018-08-15 ENCOUNTER — Other Ambulatory Visit: Payer: Self-pay | Admitting: Maternal & Fetal Medicine

## 2018-08-15 ENCOUNTER — Other Ambulatory Visit: Payer: Self-pay

## 2018-08-15 ENCOUNTER — Ambulatory Visit
Admission: RE | Admit: 2018-08-15 | Discharge: 2018-08-15 | Disposition: A | Discharge status: Home / Self Care | Payer: Medicaid Other | Source: Ambulatory Visit

## 2018-08-15 DIAGNOSIS — O10919 Unspecified pre-existing hypertension complicating pregnancy, unspecified trimester: Secondary | ICD-10-CM

## 2018-08-15 DIAGNOSIS — O365990 Maternal care for other known or suspected poor fetal growth, unspecified trimester, not applicable or unspecified: Secondary | ICD-10-CM

## 2018-08-18 ENCOUNTER — Inpatient Hospital Stay
Admission: RE | Admit: 2018-08-18 | Discharge: 2018-08-18 | Disposition: A | Discharge status: Home / Self Care | Payer: Medicaid Other | Source: Ambulatory Visit

## 2018-08-18 ENCOUNTER — Other Ambulatory Visit: Payer: Self-pay | Admitting: Maternal & Fetal Medicine

## 2018-08-18 DIAGNOSIS — O365930 Maternal care for other known or suspected poor fetal growth, third trimester, not applicable or unspecified: Secondary | ICD-10-CM

## 2018-08-19 DIAGNOSIS — O99513 Diseases of the respiratory system complicating pregnancy, third trimester: Secondary | ICD-10-CM | POA: Diagnosis not present

## 2018-08-19 DIAGNOSIS — O09892 Supervision of other high risk pregnancies, second trimester: Secondary | ICD-10-CM | POA: Diagnosis not present

## 2018-08-19 DIAGNOSIS — O10013 Pre-existing essential hypertension complicating pregnancy, third trimester: Secondary | ICD-10-CM | POA: Diagnosis not present

## 2018-08-19 DIAGNOSIS — D573 Sickle-cell trait: Secondary | ICD-10-CM | POA: Diagnosis not present

## 2018-08-22 ENCOUNTER — Inpatient Hospital Stay
Admission: RE | Admit: 2018-08-22 | Discharge: 2018-08-22 | Disposition: A | Discharge status: Home / Self Care | Payer: Medicaid Other | Source: Ambulatory Visit

## 2018-08-25 ENCOUNTER — Inpatient Hospital Stay
Admission: RE | Admit: 2018-08-25 | Discharge: 2018-08-25 | Disposition: A | Discharge status: Home / Self Care | Payer: Medicaid Other | Source: Ambulatory Visit

## 2018-08-25 ENCOUNTER — Other Ambulatory Visit: Payer: Self-pay

## 2018-08-25 DIAGNOSIS — O10919 Unspecified pre-existing hypertension complicating pregnancy, unspecified trimester: Secondary | ICD-10-CM

## 2018-08-29 ENCOUNTER — Inpatient Hospital Stay
Admission: RE | Admit: 2018-08-29 | Discharge: 2018-08-29 | Disposition: A | Discharge status: Home / Self Care | Payer: Medicaid Other | Source: Ambulatory Visit

## 2018-08-29 ENCOUNTER — Other Ambulatory Visit: Payer: Self-pay

## 2018-08-29 DIAGNOSIS — O10919 Unspecified pre-existing hypertension complicating pregnancy, unspecified trimester: Secondary | ICD-10-CM

## 2018-09-01 ENCOUNTER — Other Ambulatory Visit: Payer: Self-pay

## 2018-09-01 ENCOUNTER — Inpatient Hospital Stay: Admission: RE | Admit: 2018-09-01 | Payer: Medicaid Other | Source: Ambulatory Visit

## 2018-09-01 DIAGNOSIS — O10919 Unspecified pre-existing hypertension complicating pregnancy, unspecified trimester: Secondary | ICD-10-CM

## 2018-09-05 ENCOUNTER — Ambulatory Visit
Admission: RE | Admit: 2018-09-05 | Discharge: 2018-09-05 | Disposition: A | Discharge status: Home / Self Care | Payer: Medicaid Other | Source: Ambulatory Visit

## 2018-09-05 ENCOUNTER — Telehealth: Payer: Self-pay

## 2018-09-05 ENCOUNTER — Other Ambulatory Visit: Payer: Self-pay

## 2018-09-05 NOTE — Progress Notes (Signed)
Contacted [facility] about pt's failure to keep scheduled appts at both [facility] and [facility].  Spoke with Lorayne Bender @ [facility] who will look into her Case and notify the case worker to see what needs to be done to assist pt at this time.

## 2018-09-05 NOTE — Telephone Encounter (Signed)
Called Dominique Baker to check on her since she has missed her last few appts. at [facility].  Called to remind her that she has a appt scheduled for this Thursday @ 0800. Pt works nights and has transportation problem.  She did state that she has plans in place now for transportation and that she would be able to keep her appt on Thursday.  Pt also stated that she has not been going to [facility] for her care.  I will call to notify them that an appt needs to be scheduled for her.

## 2018-09-08 ENCOUNTER — Other Ambulatory Visit: Payer: Self-pay

## 2018-09-08 ENCOUNTER — Observation Stay: Payer: Medicaid Other

## 2018-09-08 ENCOUNTER — Observation Stay
Admission: RE | Admit: 2018-09-08 | Discharge: 2018-09-08 | Disposition: A | Discharge status: Home / Self Care | Payer: Medicaid Other | Attending: Certified Nurse Midwife | Admitting: Certified Nurse Midwife

## 2018-09-08 ENCOUNTER — Ambulatory Visit
Admission: RE | Admit: 2018-09-08 | Discharge: 2018-09-08 | Disposition: A | Discharge status: Home / Self Care | Payer: Medicaid Other | Source: Ambulatory Visit | Attending: Obstetrics and Gynecology | Admitting: Obstetrics and Gynecology

## 2018-09-08 ENCOUNTER — Other Ambulatory Visit: Payer: Self-pay | Admitting: Certified Nurse Midwife

## 2018-09-08 DIAGNOSIS — R030 Elevated blood-pressure reading, without diagnosis of hypertension: Secondary | ICD-10-CM | POA: Diagnosis not present

## 2018-09-08 DIAGNOSIS — Z20828 Contact with and (suspected) exposure to other viral communicable diseases: Secondary | ICD-10-CM | POA: Diagnosis not present

## 2018-09-08 DIAGNOSIS — Z7951 Long term (current) use of inhaled steroids: Secondary | ICD-10-CM | POA: Insufficient documentation

## 2018-09-08 DIAGNOSIS — Z87891 Personal history of nicotine dependence: Secondary | ICD-10-CM | POA: Insufficient documentation

## 2018-09-08 DIAGNOSIS — O163 Unspecified maternal hypertension, third trimester: Secondary | ICD-10-CM | POA: Diagnosis present

## 2018-09-08 DIAGNOSIS — J45909 Unspecified asthma, uncomplicated: Secondary | ICD-10-CM | POA: Diagnosis not present

## 2018-09-08 DIAGNOSIS — Z79899 Other long term (current) drug therapy: Secondary | ICD-10-CM | POA: Insufficient documentation

## 2018-09-08 DIAGNOSIS — O99513 Diseases of the respiratory system complicating pregnancy, third trimester: Secondary | ICD-10-CM | POA: Diagnosis not present

## 2018-09-08 DIAGNOSIS — R600 Localized edema: Secondary | ICD-10-CM

## 2018-09-08 DIAGNOSIS — D573 Sickle-cell trait: Secondary | ICD-10-CM | POA: Insufficient documentation

## 2018-09-08 DIAGNOSIS — O26893 Other specified pregnancy related conditions, third trimester: Secondary | ICD-10-CM | POA: Diagnosis present

## 2018-09-08 DIAGNOSIS — Z3A36 36 weeks gestation of pregnancy: Secondary | ICD-10-CM | POA: Diagnosis not present

## 2018-09-08 DIAGNOSIS — Z7982 Long term (current) use of aspirin: Secondary | ICD-10-CM | POA: Diagnosis not present

## 2018-09-08 DIAGNOSIS — O10919 Unspecified pre-existing hypertension complicating pregnancy, unspecified trimester: Secondary | ICD-10-CM

## 2018-09-08 DIAGNOSIS — O365930 Maternal care for other known or suspected poor fetal growth, third trimester, not applicable or unspecified: Secondary | ICD-10-CM | POA: Insufficient documentation

## 2018-09-08 DIAGNOSIS — O10913 Unspecified pre-existing hypertension complicating pregnancy, third trimester: Secondary | ICD-10-CM | POA: Diagnosis not present

## 2018-09-08 DIAGNOSIS — O365990 Maternal care for other known or suspected poor fetal growth, unspecified trimester, not applicable or unspecified: Secondary | ICD-10-CM

## 2018-09-08 DIAGNOSIS — O1203 Gestational edema, third trimester: Secondary | ICD-10-CM | POA: Diagnosis not present

## 2018-09-08 HISTORY — DX: Unspecified asthma, uncomplicated: J45.909

## 2018-09-08 LAB — TYPE AND SCREEN
ABO/RH(D): O POS
Antibody Screen: NEGATIVE

## 2018-09-08 LAB — COMPREHENSIVE METABOLIC PANEL
ALT: 23 U/L (ref 0–44)
AST: 27 U/L (ref 15–41)
Albumin: 3 g/dL — ABNORMAL LOW (ref 3.5–5.0)
Alkaline Phosphatase: 89 U/L (ref 38–126)
Anion gap: 12 (ref 5–15)
BUN: 7 mg/dL (ref 6–20)
CO2: 21 mmol/L — ABNORMAL LOW (ref 22–32)
Calcium: 9 mg/dL (ref 8.9–10.3)
Chloride: 104 mmol/L (ref 98–111)
Creatinine, Ser: 0.53 mg/dL (ref 0.44–1.00)
GFR calc Af Amer: >60 mL/min (ref >60)
GFR calc non Af Amer: >60 mL/min (ref >60)
Glucose, Bld: 73 mg/dL (ref 70–99)
Potassium: 3.9 mmol/L (ref 3.5–5.1)
Sodium: 137 mmol/L (ref 135–145)
Total Bilirubin: 0.9 mg/dL (ref 0.3–1.2)
Total Protein: 6.5 g/dL (ref 6.5–8.1)

## 2018-09-08 LAB — LACTATE DEHYDROGENASE: LDH: 182 U/L (ref 98–192)

## 2018-09-08 LAB — RAPID HIV SCREEN (HIV 1/2 AB+AG)
HIV 1/2 Antibodies: NONREACTIVE
HIV-1 P24 Antigen - HIV24: NONREACTIVE

## 2018-09-08 LAB — OB RESULTS CONSOLE GBS: GBS: POSITIVE

## 2018-09-08 LAB — URIC ACID: Uric Acid, Serum: 5.4 mg/dL (ref 2.5–7.1)

## 2018-09-08 LAB — CBC
HCT: 34.2 % — ABNORMAL LOW (ref 36.0–46.0)
Hemoglobin: 11.3 g/dL — ABNORMAL LOW (ref 12.0–15.0)
MCH: 28 pg (ref 26.0–34.0)
MCHC: 33 g/dL (ref 30.0–36.0)
MCV: 84.7 fL (ref 80.0–100.0)
Platelets: 161 10*3/uL (ref 150–400)
RBC: 4.04 MIL/uL (ref 3.87–5.11)
RDW: 14.6 % (ref 11.5–15.5)
WBC: 7.6 10*3/uL (ref 4.0–10.5)
nRBC: 0 % (ref 0.0–0.2)

## 2018-09-08 LAB — SARS CORONAVIRUS 2 BY RT PCR (HOSPITAL ORDER, PERFORMED IN ~~LOC~~ HOSPITAL LAB): SARS Coronavirus 2: NEGATIVE

## 2018-09-08 LAB — PROTEIN / CREATININE RATIO, URINE
Creatinine, Urine: 99 mg/dL
Total Protein, Urine: <6 mg/dL

## 2018-09-08 LAB — OB RESULTS CONSOLE GC/CHLAMYDIA
Chlamydia: NEGATIVE
Gonorrhea: NEGATIVE

## 2018-09-08 LAB — OB RESULTS CONSOLE HIV ANTIBODY (ROUTINE TESTING): HIV: NONREACTIVE

## 2018-09-08 LAB — CHLAMYDIA/NGC RT PCR (ARMC ONLY)
Chlamydia Tr: NOT DETECTED
N gonorrhoeae: NOT DETECTED

## 2018-09-08 LAB — GROUP B STREP BY PCR: Group B strep by PCR: POSITIVE — AB

## 2018-09-08 MED ORDER — LACTATED RINGERS IV BOLUS: 500.0000 mL | Freq: Once | INTRAVENOUS | Status: DC

## 2018-09-08 MED ORDER — LABETALOL HCL 300 MG PO TABS: 300.0000 mg | ORAL_TABLET | Freq: Three times a day (TID) | ORAL | 0 refills | Status: DC

## 2018-09-08 MED ORDER — PROCHLORPERAZINE EDISYLATE 10 MG/2ML IJ SOLN: 10.0000 mg | Freq: Once | INTRAMUSCULAR | Status: DC

## 2018-09-08 MED ORDER — ACETAMINOPHEN 500 MG PO TABS: 1000.0000 mg | ORAL_TABLET | Freq: Four times a day (QID) | ORAL | Status: DC | PRN

## 2018-09-08 MED ORDER — DIPHENHYDRAMINE HCL 25 MG PO CAPS: 25.0000 mg | ORAL_CAPSULE | Freq: Once | ORAL | Status: DC

## 2018-09-08 NOTE — Discharge Summary (Addendum)
Dominique Baker is a 33 y.o. female. She is at [redacted]w[redacted]d gestation. Patient's last menstrual period was 12/25/2017. Estimated Date of Delivery: 10/01/18  Prenatal care site: [facility]    History of Present Illness:  Chief Complaint: elevated blood pressures  HPI:  Dominique Baker is a 33 y.o. [id number] female at [redacted]w[redacted]d dated by LMP c/w [redacted]w[redacted]d ultrasound. She presents to L&D for evaluation for preeclampsia. She was sent from office visit at [facility].   She reports:  -active fetal movement -no leakage of fluid  -no vaginal bleeding -occasional contractions -she had a severe headache with spots in her vision two days ago, but it has resolved completely and she is not having that right now -RUQ pain -no nausea or vomiting  Pregnancy Issues: 1. Limited prenatal care, started at [facility] at 22w, one appointment at [facility] at [redacted]w[redacted]d 2. Prepregnancy BMI 41 3. Hypertension, chronic vs gestational, on labetalol 200mg  TID 4. History of preterm deliveries at 5-6 months and 7-8 months, declined Makena 5. History of preeclampsia in 2018 pregnancy, required antihypertensives and magnesium  6. 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 Anemia on iron supplementation 9. Sickle cell trait, FOB has not been tested 10. Fetal growth restriction, <3% on ultrasound 09/08/2018  S: Resting comfortably.   Maternal Medical History:   Past Medical History:  Diagnosis Date  . Asthma   . Hypertension    Chronic    History reviewed. No pertinent surgical history.  No Known Allergies  Prior to Admission medications   Medication Sig Start Date End Date Taking? Authorizing Provider  aspirin (ASPIRIN CHILDRENS) 81 MG chewable tablet Chew 1 tablet (81 mg total) by mouth daily. 06/16/18 06/16/19 Yes Jimmey Ralph, MD  beclomethasone (QVAR REDIHALER) 40 MCG/ACT inhaler Inhale 2 puffs into the lungs 2 (two) times daily. 06/16/18  Yes Jimmey Ralph, MD   mometasone [facility]) 220 MCG/INH inhaler Inhale 2 puffs into the lungs daily.   Yes [provider]  labetalol (NORMODYNE) 100 MG tablet Take 2 tablets (200 mg total) by mouth 3 (three) times daily for 30 days. 06/16/18 07/16/18  Jimmey Ralph, MD     Social History: She  reports that she quit smoking about 2 years ago. She smoked 0.25 packs per day. She has never used smokeless tobacco. She reports that she does not drink alcohol or use drugs.  Family History: family history includes Diabetes in her mother; Hypertension in her mother; Sickle cell anemia in her mother; Sickle cell trait in her brother, brother, brother, daughter, sister, sister, sister, sister, sister, son, son, and son.   Review of Systems: A full review of systems was performed and negative except as noted in the HPI.     O:  BP 140/71 (BP Location: Left Arm)   Pulse 75   Temp 98.2 F (36.8 C) (Oral)   Resp 18   LMP 12/25/2017   General:   alert, cooperative, appears stated age and no distress  Skin:  normal and no rash or abnormalities  Neurologic:    Alert & oriented x 3  Lungs:   clear to auscultation bilaterally  Heart:   regular rate and rhythm, S1, S2 normal, no murmur, click, rub or gallop  Abdomen:  Soft, mild RUQ tenderness, bowel sounds normal, no masses, no organomegaly  Pelvis:  External genitalia: normal general appearance  FHT:  140 BPM  Presentations: cephalic  Cervix:    Dilation: 1cm  Effacement: 50%   Station:  -3   Consistency: soft   Position: middle  Extremities: : non-tender, symmetric, +1 edema bilaterally.     NST:  Baseline: 135bpm Variability: moderate Accelerations: 15x15 present x >2 Decelerations: absent Time: Toco: uterine irritability with occasional contraction  Results for orders placed or performed during the hospital encounter of 09/08/18 (from the past 24 hour(s))  Protein / creatinine ratio, urine     Status: None   Collection Time:  09/08/18 10:09 AM  Result Value Ref Range   Creatinine, Urine 99 mg/dL   Total Protein, Urine <6 mg/dL   Protein Creatinine Ratio        0.00 - 0.15 mg/mg[Cre]  Chlamydia/NGC rt PCR ([facility] only)     Status: None   Collection Time: 09/08/18 10:09 AM   Specimen: Urine  Result Value Ref Range   Specimen source GC/Chlam URINE, RANDOM    Chlamydia Tr NOT DETECTED NOT DETECTED   N gonorrhoeae NOT DETECTED NOT DETECTED  Type and screen [facility]     Status: None   Collection Time: 09/08/18 10:59 AM  Result Value Ref Range   ABO/RH(D) O POS    Antibody Screen NEG    Sample Expiration      09/11/2018,2359 Performed at [facility], [address] [place], [place] [address]   Comprehensive metabolic panel     Status: Abnormal   Collection Time: 09/08/18 10:59 AM  Result Value Ref Range   Sodium 137 135 - 145 mmol/L   Potassium 3.9 3.5 - 5.1 mmol/L   Chloride 104 98 - 111 mmol/L   CO2 21 (L) 22 - 32 mmol/L   Glucose, Bld 73 70 - 99 mg/dL   BUN 7 6 - 20 mg/dL   Creatinine, Ser 6.04 0.44 - 1.00 mg/dL   Calcium 9.0 8.9 - 54.0 mg/dL   Total Protein 6.5 6.5 - 8.1 g/dL   Albumin 3.0 (L) 3.5 - 5.0 g/dL   AST 27 15 - 41 U/L   ALT 23 0 - 44 U/L   Alkaline Phosphatase 89 38 - 126 U/L   Total Bilirubin 0.9 0.3 - 1.2 mg/dL   GFR calc non Af Amer >60 >60 mL/min   GFR calc Af Amer >60 >60 mL/min   Anion gap 12 5 - 15  Lactate dehydrogenase     Status: None   Collection Time: 09/08/18 10:59 AM  Result Value Ref Range   LDH 182 98 - 192 U/L  Uric acid     Status: None   Collection Time: 09/08/18 10:59 AM  Result Value Ref Range   Uric Acid, Serum 5.4 2.5 - 7.1 mg/dL  CBC on admission     Status: Abnormal   Collection Time: 09/08/18 10:59 AM  Result Value Ref Range   WBC 7.6 4.0 - 10.5 K/uL   RBC 4.04 3.87 - 5.11 MIL/uL   Hemoglobin 11.3 (L) 12.0 - 15.0 g/dL   HCT 98.1 (L) 19.1 - 47.8 %   MCV 84.7 80.0 - 100.0 fL   MCH 28.0 26.0 - 34.0 pg   MCHC 33.0  30.0 - 36.0 g/dL   RDW 29.5 62.1 - 30.8 %   Platelets 161 150 - 400 K/uL   nRBC 0.0 0.0 - 0.2 %  Rapid HIV screen (HIV 1/2 Ab+Ag)     Status: None   Collection Time: 09/08/18 10:59 AM  Result Value Ref Range   HIV-1 P24 Antigen - HIV24 NON REACTIVE  NON REACTIVE   HIV 1/2 Antibodies NON REACTIVE NON REACTIVE   Interpretation (HIV Ag Ab)      A non reactive test result means that HIV 1 or HIV 2 antibodies and HIV 1 p24 antigen were not detected in the specimen.     [place] Venous Img Lower Unilateral Right  Result Date: 09/08/2018 CLINICAL DATA:  Right lower extremity pain and edema. Evaluate for DVT. EXAM: RIGHT LOWER EXTREMITY VENOUS DOPPLER ULTRASOUND TECHNIQUE: Gray-scale sonography with graded compression, as well as color Doppler and duplex ultrasound were performed to evaluate the lower extremity deep venous systems from the level of the common femoral vein and including the common femoral, femoral, profunda femoral, popliteal and calf veins including the posterior tibial, peroneal and gastrocnemius veins when visible. The superficial great saphenous vein was also interrogated. Spectral Doppler was utilized to evaluate flow at rest and with distal augmentation maneuvers in the common femoral, femoral and popliteal veins. COMPARISON:  None. FINDINGS: Contralateral Common Femoral Vein: Respiratory phasicity is normal and symmetric with the symptomatic side. No evidence of thrombus. Normal compressibility. Common Femoral Vein: Rouleaux flow was demonstrated within right common femoral vein however there is no evidence of thrombus. Normal compressibility, respiratory phasicity and response to augmentation. Saphenofemoral Junction: No evidence of thrombus. Normal compressibility and flow on color Doppler imaging. Profunda Femoral Vein: No evidence of thrombus. Normal compressibility and flow on color Doppler imaging. Femoral Vein: No evidence of thrombus. Normal compressibility, respiratory phasicity and  response to augmentation. Popliteal Vein: No evidence of thrombus. Normal compressibility, respiratory phasicity and response to augmentation. Calf Veins: No evidence of thrombus. Normal compressibility and flow on color Doppler imaging. Superficial Great Saphenous Vein: No evidence of thrombus. Normal compressibility. Venous Reflux:  None. Other Findings:  None IMPRESSION: No evidence of DVT within the right lower extremity. Electronically Signed   By: Simonne ComeJohn  Watts M.D.   On: 09/08/2018 11:58   [place] Mfm Fetal Bpp Wo Non Stress  Result Date: 09/08/2018 ----------------------------------------------------------------------  OBSTETRICS REPORT                       (Signed Final 09/08/2018 09:09 am) ---------------------------------------------------------------------- PATIENT INFO:  ID #:       [id number]                          D.O.B.:  [birth year] (32 yrs)  Name:       Dominique InglesVANESSA Donigan                Visit Date: 09/08/2018 08:46 am ---------------------------------------------------------------------- PERFORMED BY:  Performed By:     Baron HamperMeagan Hickernell      Ref. Address:     [address]                                                             [address],                                                             [place], [place]  [address]  Referred By:      Herbie Saxon [name] ---------------------------------------------------------------------- SERVICE(S) PROVIDED:   [place] MFM FETAL BPP WO NON STRESS                       [phone]  ---------------------------------------------------------------------- INDICATIONS:   [redacted] weeks gestation of pregnancy                Z3A.36   Increased BMI   IUGR   Chronic HTN  ---------------------------------------------------------------------- FETAL EVALUATION:  Num Of Fetuses:         1  Fetal Heart Rate(bpm):  133  Cardiac Activity:       Present  Presentation:           Cephalic  Placenta:                Anterior, No previa doc previously  Amniotic Fluid  AFI FV:      Within normal limits  AFI Sum(cm)     %Tile       Largest Pocket(cm)  11.9            37          4.15 ---------------------------------------------------------------------- BIOPHYSICAL EVALUATION:  Amniotic F.V:   Within normal limits       F. Tone:        Observed  F. Movement:    Observed                   Score:          8/8  F. Breathing:   Observed ---------------------------------------------------------------------- BIOMETRY:  BPD:      75.1  mm     G. Age:  30w 1d        < 1  %    CI:        71.77   %    70 - 86                                                          FL/HC:      23.1   %    20.8 - 22.6  HC:      282.2  mm     G. Age:  30w 6d        < 3  %    HC/AC:      0.98        0.92 - 1.05  AC:      289.1  mm     G. Age:  32w 6d        < 3  %    FL/BPD:     87.0   %    71 - 87  FL:       65.3  mm     G. Age:  33w 5d        < 3  %    FL/AC:      22.6   %    20 - 24  HUM:      58.8  mm     G. Age:  34w 0d         19  %  Est. FW:    2023  gm      4 lb 7 oz    < 3  % ---------------------------------------------------------------------- GESTATIONAL AGE:  LMP:           36w 5d        Date:  12/25/17                 EDD:   10/01/18  U/S Today:     31w 6d                                        EDD:   11/04/18  Best:          36w 5d     Det. By:  LMP  (12/25/17)          EDD:   10/01/18 ---------------------------------------------------------------------- ANATOMY:  Cavum:                 Visualized             Stomach:                Seen                         previously  Ventricles:            Normal appearance      Abdominal Wall:         Visualized                                                                        previously  Cerebellum:            Visualized             Cord Vessels:           3 vessels,                         previously                                     visualized previously  Posterior Fossa:       Visualized              Kidneys:                Normal appearance                         previously  Face:                  Orbits visualized      Bladder:                Seen                         previously  Lips:                  Visualized  Spine:                  Visualized                         previously                                     previously  Heart:                 Suboptimal             Upper Extremities:      Visualized                                                                        previously  RVOT:                  Suboptimal             Lower Extremities:      Visualized                                                                        previously  LVOT:                  Suboptimal  Other:  Ao/Pa Suboptimal ---------------------------------------------------------------------- DOPPLER - FETAL VESSELS:  Umbilical Artery   S/D     %tile  2.15       36 ---------------------------------------------------------------------- IMPRESSION:  Intrauterine pregnancy with a best estimated gestational age  of [redacted] weeks 5 days.  Dating is based on earliest available  ultrasound performed at [facility] on 04/21/2018;  measurements were reported as 16 weeks 6 days.  Follow up ultrasound performed to assess growth.  Due to late gestational age and fetal position, heart views  were suboptimally visualized but have been documented  previously.  Other fetal anatomy views appear normal or have  been documented previously.  EFW is <3rd percentile with symmetric growth restriction.  Head circumference is lagging by about 6 weeks, measuring  between 2 and 3 SD below the mean for gestational age.  Mean umbilical cord Dopplers s/d ratio is 2.15 which is  normal for 36 weeks.  BPP 8/8.  Findings were discussed.  Ms. Gerard has an elevated BP  today (148/94) despite taking her medication this morning.  She also reports increasing lower extremity edema which is  becoming more severe (appears to be pitting  edema 1+ up to  the level of her knees).  She apparently called last week  regarding concerning symptoms but didn't follow through with  recommendation to be evaluated on L&D.  Given elevated BP, edema and severe growth restriction, will  send to L&D for evaluation of preeclampsia.  In the absence  of preeclampsia, recommend delivery at 37 weeks given  EFW <3rd percentile. ----------------------------------------------------------------------  Kirby Funk, MD Electronically Signed Final Report   09/08/2018 09:09 am ----------------------------------------------------------------------  [place] Mfm Ob Follow Up  Result Date: 08/11/2018 ----------------------------------------------------------------------  OBSTETRICS REPORT                    (Corrected Final 08/11/2018 11:28 am) ---------------------------------------------------------------------- PATIENT INFO:  ID #:       [id number]                          D.O.B.:  09-30-1985 (32 yrs)  Name:       Dominique Baker                Visit Date: 08/11/2018 10:55 am ---------------------------------------------------------------------- PERFORMED BY:  Performed By:     Criselda Peaches         Ref. Address:     [address]                                                             [place],                                                             [place], [place]                                                             [address]  Referred By:      Alberteen Spindle [name] ---------------------------------------------------------------------- SERVICE(S) PROVIDED:   [place] MFM OB FOLLOW UP                                  [phone]  ---------------------------------------------------------------------- INDICATIONS:   [redacted] weeks gestation of pregnancy                Z3A.32  ---------------------------------------------------------------------- FETAL EVALUATION:  Num Of Fetuses:         1  Fetal Heart Rate(bpm):   137  Presentation:           Cephalic  Placenta:               Anterior  AFI Sum(cm)     %Tile       Largest Pocket(cm)  11.23           27          5.6  RUQ(cm)       RLQ(cm)       LUQ(cm)        LLQ(cm)  5.6           0  4.71           0.92 ---------------------------------------------------------------------- BIOPHYSICAL EVALUATION:  Amniotic F.V:   Within normal limits       F. Tone:        Observed  F. Movement:    Observed                   Score:          8/8  F. Breathing:   Observed ---------------------------------------------------------------------- BIOMETRY:  BPD:      70.7  mm     G. Age:  28w 3d        < 1  %    CI:        70.34   %    70 - 86                                                          FL/HC:      22.8   %    19.9 - 21.5  HC:      268.8  mm     G. Age:  29w 2d        < 3  %    HC/AC:      0.97        0.96 - 1.11  AC:      278.1  mm     G. Age:  31w 6d         26  %    FL/BPD:     86.6   %    71 - 87  FL:       61.2  mm     G. Age:  31w 5d         17  %    FL/AC:      22.0   %    20 - 24  HUM:      53.9  mm     G. Age:  31w 2d         30  %  Est. FW:    1732  gm    3 lb 13 oz       9  % ---------------------------------------------------------------------- GESTATIONAL AGE:  LMP:           32w 5d        Date:  12/25/17                 EDD:   10/01/18  U/S Today:     30w 2d                                        EDD:   10/18/18  Best:          32w 5d     Det. By:  LMP  (12/25/17)          EDD:   10/01/18 ---------------------------------------------------------------------- ANATOMY:  Cavum:                 Visualized             Stomach:                Seen  previously  Ventricles:            Normal appearance      Abdominal Wall:         Visualized                                                                        previously  Cerebellum:            Visualized             Cord Vessels:           3 vessels,                         previously                                      visualized previously  Posterior Fossa:       Visualized             Kidneys:                Normal appearance                         previously  Face:                  Orbits visualized      Bladder:                Seen                         previously  Lips:                  Visualized             Spine:                  Visualized                         previously                                     previously  Heart:                 4-Chamber view         Upper Extremities:      Visualized                         appears normal                                 previously  RVOT:                  Not seen well due      Lower Extremities:      Visualized  to postion                                     previously  LVOT:                  Not visualized due                         to position ---------------------------------------------------------------------- DOPPLER - FETAL VESSELS:  Umbilical Artery   S/D     %tile  2.57       47 ---------------------------------------------------------------------- IMPRESSION:  Thank you for referring your patient  for a fetal growth  evaluation due to maternal hypertension (on labetalol, BP  today 131/85), asthma, and elevated BMII.  There is a singleton gestation at 32 weeks 4 days with normal  amniotic fluid volume.  Dating is by LMP consistent iwth  earliest availabel ultrasound performed at [facility], [place], on 04/21/18; mesurements were  consistent with 16 weeks 6 days.  The BPP was 8/8, which was reassuring.  Umbilical artery  dopplers were normal.  Mild fetal growth restriction is present The possible etiologies  of IUGR were discussed at length with the patient.  The  majority of IUGR fetuses diagnosed towards the end of the  third trimester will either be constitutionally small, or have  growth restriction due to placental insufficiency.  Other  causes are aneuploidy, other genetic disorders, fetal   malformations, and fetal infection.  We addressed the fact  that chronic hypertension is the likley contributor to her fetal  growth restriction.  Doppler velocimetry is a valuable tool in assessing small  fetuses, and has been associated with a decreased mortality  in small fetuses.  Normal umbilical artery doppler findings are  very reassuring and imply a normally functioning placenta,  making growth restriction from placental insufficiency less  likely.  Recommend twice weekly testing with (NST or BPP) and  weekly dopplers and AFI.  Follow up fetal growth in 3 weeks.  As discussed, we will  schedule her for twice weekly teting  with weekly dopplers/AFI and follow up growth in 3 weeks.  We addressed possible delivery at 37w (if bp worsens) v.  39w (if bp stable and fetal testing remains reassuring)  Thank you for allowing [place] to participate in your patient's care.  Please do not hesitate to contact us if we can be of further  assistance. ----------------------------------------------------------------------                        Consuelo Pandy, MD Electronically Signed Corrected Final Report  08/11/2018 11:28 am ----------------------------------------------------------------------   Assessment:  Dominique Baker is a 33 y.o. [id number] female at [redacted]w[redacted]d with IUGR and hypertension.   Plan:   33 y.o. [redacted]w[redacted]d here for antenatal surveillance during pregnancy.  Principle diagnosis: hypertension, chronic vs gestational, on medication; lower extremity edema  Labor  Not present  Fetal Wellbeing  Reactive NST, reassuring for GA  Hypertension  No evidence of preeclampsia on labwork, no signs of pulmonary edema, no cerebral or visual disturbances  Mild range blood pressures   Increase labetalol from 200mg  TID to 300mg  TID  IUGR  EFW <3% on growth ultrasound at [facility] today  MFM recommends medical IOL at 37 weeks. IOL scheduled for 09/10/2018 at 0800. Instructions given to patient.   Lower extremity  edema  B/l  lower extremity edema, with right slightly more than left.  RLE dopplers with no evidence of DVT in her RLE  Advised hydration, compression stockings, and elevation to alleviate swellling  D/c home stable, precautions reviewed, follow-up as scheduled.   Reviewed return precautions in detail. 36 week labs collected.    Genia Del 09/08/2018 12:49 PM  ----- Genia Del, [name] Certified Nurse Midwife [facility], Department of OB/GYN [facility]

## 2018-09-08 NOTE — Progress Notes (Signed)
[facility] Given elevated BP, edema and severe growth restriction on [place] today (<3rd%), will send to L&D for evaluation of preeclampsia.  In the absence of preeclampsia, recommend delivery at 37 weeks given EFW <3rd percentile.  Note gestational age currently is [redacted]w[redacted]d.

## 2018-09-08 NOTE — Progress Notes (Signed)
Dominique Baker is a 33 y.o. [phone] female dated by LMP c/w [redacted]w[redacted]d ultrasound. She is scheduled for IOL for IUGR <3%.  Pregnancy Issues: 1. Limited prenatal care, started at [facility] at 22w, one appointment at [facility] at [redacted]w[redacted]d 2. Prepregnancy BMI 41 3. Hypertension, chronic vs gestational, on labetalol 200mg  TID 4. History of preterm deliveries at 5-6 months and 7-8 months, declined Makena 5. History of preeclampsia in 2018 pregnancy, required antihypertensives and magnesium  6. 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 Anemia on iron supplementation 9. Sickle cell trait, FOB has not been tested 10. Fetal growth restriction, <3% on ultrasound 09/08/2018 11.  FOB minimally involved, kids are currently in [place] with patient's parents and family visiting for summer. Pt considered termination but was too far along, may consider adoption, was given resources by [facility].   Prenatal care site: [facility]   Prenatal Labs: Blood type/Rh O+  Antibody screen neg  Rubella Immune  Varicella Immune  RPR NR  HBsAg Neg  HIV NR  GC neg  Chlamydia neg  Genetic screening declined  1 hour GTT 124  3 hour GTT n/a  GBS pending   Post Partum Planning: - Infant feeding: breast - Contraception: BTL or Depo-Provera

## 2018-09-08 NOTE — ED Notes (Signed)
Pt arrived to [facility] this morning for her scheduled appt. @0800 .  [redacted]w[redacted]d. Concern for pt since she had missed several appts at both [facility], [facility] and [facility].  Rasheda (Case Worker)  at [place] called to see if pt was in the clinic this AM because she had been trying to contact pt with not success. Pt had US performed and she was able to speak with Rasheda on the phone after that.  Dr. Zack Seal had [facility] following appt for Pre-eclampsia work up.  Report given to Colan Neptune, RN via telephone and Idell Pickles, LPN transported pt to OBS 4 in [facility] via wheelchair.

## 2018-09-09 LAB — RPR: RPR Ser Ql: NONREACTIVE

## 2018-09-10 ENCOUNTER — Inpatient Hospital Stay
Admission: EM | Admit: 2018-09-10 | Discharge: 2018-09-12 | DRG: 797 | Disposition: A | Discharge status: Home / Self Care | Payer: Medicaid Other | Attending: Obstetrics and Gynecology | Admitting: Obstetrics and Gynecology

## 2018-09-10 ENCOUNTER — Inpatient Hospital Stay: Payer: Medicaid Other | Admitting: Anesthesiology

## 2018-09-10 ENCOUNTER — Encounter: Payer: Self-pay | Admitting: Certified Registered"

## 2018-09-10 ENCOUNTER — Other Ambulatory Visit: Payer: Self-pay

## 2018-09-10 ENCOUNTER — Encounter: Payer: Self-pay | Admitting: *Deleted

## 2018-09-10 DIAGNOSIS — R6339 Other feeding difficulties: Secondary | ICD-10-CM

## 2018-09-10 DIAGNOSIS — Z87891 Personal history of nicotine dependence: Secondary | ICD-10-CM

## 2018-09-10 DIAGNOSIS — J45909 Unspecified asthma, uncomplicated: Secondary | ICD-10-CM | POA: Diagnosis not present

## 2018-09-10 DIAGNOSIS — O365930 Maternal care for other known or suspected poor fetal growth, third trimester, not applicable or unspecified: Secondary | ICD-10-CM | POA: Diagnosis present

## 2018-09-10 DIAGNOSIS — O134 Gestational [pregnancy-induced] hypertension without significant proteinuria, complicating childbirth: Secondary | ICD-10-CM | POA: Diagnosis not present

## 2018-09-10 DIAGNOSIS — O99824 Streptococcus B carrier state complicating childbirth: Secondary | ICD-10-CM | POA: Diagnosis not present

## 2018-09-10 DIAGNOSIS — O99214 Obesity complicating childbirth: Secondary | ICD-10-CM | POA: Diagnosis present

## 2018-09-10 DIAGNOSIS — O1092 Unspecified pre-existing hypertension complicating childbirth: Secondary | ICD-10-CM | POA: Diagnosis not present

## 2018-09-10 DIAGNOSIS — Z7951 Long term (current) use of inhaled steroids: Secondary | ICD-10-CM | POA: Diagnosis not present

## 2018-09-10 DIAGNOSIS — R633 Feeding difficulties: Secondary | ICD-10-CM

## 2018-09-10 DIAGNOSIS — O9902 Anemia complicating childbirth: Secondary | ICD-10-CM | POA: Diagnosis present

## 2018-09-10 DIAGNOSIS — Z3A37 37 weeks gestation of pregnancy: Secondary | ICD-10-CM

## 2018-09-10 DIAGNOSIS — O1002 Pre-existing essential hypertension complicating childbirth: Secondary | ICD-10-CM | POA: Diagnosis present

## 2018-09-10 DIAGNOSIS — D573 Sickle-cell trait: Secondary | ICD-10-CM | POA: Diagnosis present

## 2018-09-10 DIAGNOSIS — O9952 Diseases of the respiratory system complicating childbirth: Secondary | ICD-10-CM | POA: Diagnosis present

## 2018-09-10 DIAGNOSIS — Z3490 Encounter for supervision of normal pregnancy, unspecified, unspecified trimester: Secondary | ICD-10-CM | POA: Diagnosis present

## 2018-09-10 DIAGNOSIS — Z302 Encounter for sterilization: Secondary | ICD-10-CM | POA: Diagnosis not present

## 2018-09-10 DIAGNOSIS — I10 Essential (primary) hypertension: Secondary | ICD-10-CM | POA: Diagnosis not present

## 2018-09-10 LAB — COMPREHENSIVE METABOLIC PANEL
ALT: 21 U/L (ref 0–44)
AST: 24 U/L (ref 15–41)
Albumin: 2.7 g/dL — ABNORMAL LOW (ref 3.5–5.0)
Alkaline Phosphatase: 84 U/L (ref 38–126)
Anion gap: 9 (ref 5–15)
BUN: 5 mg/dL — ABNORMAL LOW (ref 6–20)
CO2: 23 mmol/L (ref 22–32)
Calcium: 8.7 mg/dL — ABNORMAL LOW (ref 8.9–10.3)
Chloride: 105 mmol/L (ref 98–111)
Creatinine, Ser: 0.63 mg/dL (ref 0.44–1.00)
GFR calc Af Amer: >60 mL/min (ref >60)
GFR calc non Af Amer: >60 mL/min (ref >60)
Glucose, Bld: 93 mg/dL (ref 70–99)
Potassium: 3.5 mmol/L (ref 3.5–5.1)
Sodium: 137 mmol/L (ref 135–145)
Total Bilirubin: 0.8 mg/dL (ref 0.3–1.2)
Total Protein: 6.2 g/dL — ABNORMAL LOW (ref 6.5–8.1)

## 2018-09-10 LAB — CBC
HCT: 31 % — ABNORMAL LOW (ref 36.0–46.0)
Hemoglobin: 10.3 g/dL — ABNORMAL LOW (ref 12.0–15.0)
MCH: 27.6 pg (ref 26.0–34.0)
MCHC: 33.2 g/dL (ref 30.0–36.0)
MCV: 83.1 fL (ref 80.0–100.0)
Platelets: 154 10*3/uL (ref 150–400)
RBC: 3.73 MIL/uL — ABNORMAL LOW (ref 3.87–5.11)
RDW: 14.4 % (ref 11.5–15.5)
WBC: 7.3 10*3/uL (ref 4.0–10.5)
nRBC: 0 % (ref 0.0–0.2)

## 2018-09-10 LAB — PROTEIN / CREATININE RATIO, URINE
Creatinine, Urine: 130 mg/dL
Protein Creatinine Ratio: 0.07 mg/mg{Cre} (ref 0.00–0.15)
Total Protein, Urine: 9 mg/dL

## 2018-09-10 LAB — TYPE AND SCREEN
ABO/RH(D): O POS
Antibody Screen: NEGATIVE

## 2018-09-10 MED ORDER — EPHEDRINE 5 MG/ML INJ
10.0000 mg | INTRAVENOUS | Status: DC | PRN
  Filled 2018-09-10: qty 2

## 2018-09-10 MED ORDER — SENNOSIDES-DOCUSATE SODIUM 8.6-50 MG PO TABS
2.0000 | ORAL_TABLET | ORAL | Status: DC
  Administered 2018-09-11 (×2): 2 via ORAL
  Filled 2018-09-10 (×2): qty 2

## 2018-09-10 MED ORDER — OXYTOCIN 10 UNIT/ML IJ SOLN
INTRAMUSCULAR | Status: AC
  Filled 2018-09-10: qty 2

## 2018-09-10 MED ORDER — LABETALOL HCL 200 MG PO TABS
300.0000 mg | ORAL_TABLET | Freq: Three times a day (TID) | ORAL | Status: DC
  Administered 2018-09-11: 300 mg via ORAL
  Filled 2018-09-10: qty 1

## 2018-09-10 MED ORDER — SODIUM CHLORIDE (PF) 0.9 % IJ SOLN
INTRAMUSCULAR | Status: AC
  Filled 2018-09-10: qty 50

## 2018-09-10 MED ORDER — LABETALOL HCL 5 MG/ML IV SOLN
20.0000 mg | INTRAVENOUS | Status: DC | PRN
  Filled 2018-09-10 (×2): qty 4

## 2018-09-10 MED ORDER — OXYTOCIN BOLUS FROM INFUSION
500.0000 mL | Freq: Once | INTRAVENOUS | Status: AC
  Administered 2018-09-10: 20:00:00 500 mL via INTRAVENOUS

## 2018-09-10 MED ORDER — BISACODYL 10 MG RE SUPP: 10.0000 mg | Freq: Every day | RECTAL | Status: DC | PRN

## 2018-09-10 MED ORDER — LABETALOL HCL 5 MG/ML IV SOLN
80.0000 mg | INTRAVENOUS | Status: DC | PRN
  Filled 2018-09-10: qty 16

## 2018-09-10 MED ORDER — MISOPROSTOL 100 MCG PO TABS
25.0000 ug | ORAL_TABLET | ORAL | Status: DC | PRN
  Administered 2018-09-10 (×2): 25 ug via VAGINAL
  Filled 2018-09-10 (×3): qty 1

## 2018-09-10 MED ORDER — OXYCODONE HCL 5 MG PO TABS
5.0000 mg | ORAL_TABLET | ORAL | Status: DC | PRN
  Administered 2018-09-12: 5 mg via ORAL
  Filled 2018-09-10: qty 1

## 2018-09-10 MED ORDER — OXYTOCIN 40 UNITS IN NORMAL SALINE INFUSION - SIMPLE MED
INTRAVENOUS | Status: AC
  Administered 2018-09-10: 500 mL via INTRAVENOUS
  Filled 2018-09-10: qty 1000

## 2018-09-10 MED ORDER — SIMETHICONE 80 MG PO CHEW
80.0000 mg | CHEWABLE_TABLET | ORAL | Status: DC | PRN
  Administered 2018-09-11: 80 mg via ORAL
  Filled 2018-09-10: qty 1

## 2018-09-10 MED ORDER — MEASLES, MUMPS & RUBELLA VAC IJ SOLR
0.5000 mL | Freq: Once | INTRAMUSCULAR | Status: DC
  Filled 2018-09-10: qty 0.5

## 2018-09-10 MED ORDER — ONDANSETRON HCL 4 MG/2ML IJ SOLN: 4.0000 mg | INTRAMUSCULAR | Status: DC | PRN

## 2018-09-10 MED ORDER — PRENATAL MULTIVITAMIN CH
1.0000 | ORAL_TABLET | Freq: Every day | ORAL | Status: DC
  Administered 2018-09-12: 1 via ORAL
  Filled 2018-09-10: qty 1

## 2018-09-10 MED ORDER — DIBUCAINE (PERIANAL) 1 % EX OINT: 1.0000 "application " | TOPICAL_OINTMENT | CUTANEOUS | Status: DC | PRN

## 2018-09-10 MED ORDER — WITCH HAZEL-GLYCERIN EX PADS: 1.0000 "application " | MEDICATED_PAD | CUTANEOUS | Status: DC | PRN

## 2018-09-10 MED ORDER — COCONUT OIL OIL
1.0000 "application " | TOPICAL_OIL | Status: DC | PRN
  Administered 2018-09-12: 1 via TOPICAL
  Filled 2018-09-10: qty 120

## 2018-09-10 MED ORDER — FENTANYL 2.5 MCG/ML W/ROPIVACAINE 0.15% IN NS 100 ML EPIDURAL (ARMC): 12.0000 mL/h | EPIDURAL | Status: DC

## 2018-09-10 MED ORDER — BUTORPHANOL TARTRATE 1 MG/ML IJ SOLN: 1.0000 mg | INTRAMUSCULAR | Status: DC | PRN

## 2018-09-10 MED ORDER — LABETALOL HCL 200 MG PO TABS
300.0000 mg | ORAL_TABLET | Freq: Three times a day (TID) | ORAL | Status: DC
  Administered 2018-09-10: 300 mg via ORAL
  Filled 2018-09-10: qty 3

## 2018-09-10 MED ORDER — SODIUM CHLORIDE 0.9 % IV SOLN: 250.0000 mL | INTRAVENOUS | Status: DC | PRN

## 2018-09-10 MED ORDER — PHENYLEPHRINE 40 MCG/ML (10ML) SYRINGE FOR IV PUSH (FOR BLOOD PRESSURE SUPPORT)
80.0000 ug | PREFILLED_SYRINGE | INTRAVENOUS | Status: DC | PRN
  Filled 2018-09-10: qty 10

## 2018-09-10 MED ORDER — ENOXAPARIN SODIUM 40 MG/0.4ML ~~LOC~~ SOLN
40.0000 mg | SUBCUTANEOUS | Status: DC
  Administered 2018-09-11 – 2018-09-12 (×2): 40 mg via SUBCUTANEOUS
  Filled 2018-09-10 (×2): qty 0.4

## 2018-09-10 MED ORDER — FAMOTIDINE 20 MG PO TABS
40.0000 mg | ORAL_TABLET | Freq: Once | ORAL | Status: AC
  Administered 2018-09-11: 40 mg via ORAL
  Filled 2018-09-10: qty 2

## 2018-09-10 MED ORDER — IBUPROFEN 600 MG PO TABS
600.0000 mg | ORAL_TABLET | Freq: Four times a day (QID) | ORAL | Status: DC
  Administered 2018-09-10 – 2018-09-12 (×7): 600 mg via ORAL
  Filled 2018-09-10 (×7): qty 1

## 2018-09-10 MED ORDER — SODIUM CHLORIDE 0.9% FLUSH: 3.0000 mL | Freq: Two times a day (BID) | INTRAVENOUS | Status: DC

## 2018-09-10 MED ORDER — FENTANYL 2.5 MCG/ML W/ROPIVACAINE 0.15% IN NS 100 ML EPIDURAL (ARMC)
EPIDURAL | Status: AC
  Filled 2018-09-10: qty 100

## 2018-09-10 MED ORDER — TETANUS-DIPHTH-ACELL PERTUSSIS 5-2.5-18.5 LF-MCG/0.5 IM SUSP: 0.5000 mL | Freq: Once | INTRAMUSCULAR | Status: DC

## 2018-09-10 MED ORDER — ACETAMINOPHEN 325 MG PO TABS: 650.0000 mg | ORAL_TABLET | ORAL | Status: DC | PRN

## 2018-09-10 MED ORDER — MISOPROSTOL 200 MCG PO TABS
ORAL_TABLET | ORAL | Status: AC
  Administered 2018-09-10: 11:00:00 25 ug via BUCCAL
  Filled 2018-09-10: qty 4

## 2018-09-10 MED ORDER — DIPHENHYDRAMINE HCL 25 MG PO CAPS: 25.0000 mg | ORAL_CAPSULE | Freq: Four times a day (QID) | ORAL | Status: DC | PRN

## 2018-09-10 MED ORDER — FLEET ENEMA 7-19 GM/118ML RE ENEM: 1.0000 | ENEMA | Freq: Every day | RECTAL | Status: DC | PRN

## 2018-09-10 MED ORDER — ONDANSETRON HCL 4 MG/2ML IJ SOLN: 4.0000 mg | Freq: Four times a day (QID) | INTRAMUSCULAR | Status: DC | PRN

## 2018-09-10 MED ORDER — LACTATED RINGERS IV SOLN
INTRAVENOUS | Status: DC
  Administered 2018-09-10 (×2): via INTRAVENOUS

## 2018-09-10 MED ORDER — LACTATED RINGERS IV SOLN
500.0000 mL | Freq: Once | INTRAVENOUS | Status: AC
  Administered 2018-09-10: 500 mL via INTRAVENOUS

## 2018-09-10 MED ORDER — SODIUM CHLORIDE 0.9 % IV SOLN
5.0000 10*6.[IU] | Freq: Once | INTRAVENOUS | Status: AC
  Administered 2018-09-10: 5 10*6.[IU] via INTRAVENOUS
  Filled 2018-09-10: qty 5

## 2018-09-10 MED ORDER — PENICILLIN G POTASSIUM 5000000 UNITS IJ SOLR
2.5000 10*6.[IU] | INTRAVENOUS | Status: DC
  Filled 2018-09-10 (×8): qty 2.5

## 2018-09-10 MED ORDER — MISOPROSTOL 100 MCG PO TABS
25.0000 ug | ORAL_TABLET | ORAL | Status: DC | PRN
  Administered 2018-09-10 (×2): 25 ug via BUCCAL
  Filled 2018-09-10 (×3): qty 1

## 2018-09-10 MED ORDER — SOD CITRATE-CITRIC ACID 500-334 MG/5ML PO SOLN: 30.0000 mL | ORAL | Status: DC | PRN

## 2018-09-10 MED ORDER — ACETAMINOPHEN 325 MG PO TABS
650.0000 mg | ORAL_TABLET | ORAL | Status: DC | PRN
  Administered 2018-09-11 – 2018-09-12 (×5): 650 mg via ORAL
  Filled 2018-09-10 (×5): qty 2

## 2018-09-10 MED ORDER — METOCLOPRAMIDE HCL 10 MG PO TABS
10.0000 mg | ORAL_TABLET | Freq: Once | ORAL | Status: AC
  Administered 2018-09-11: 10 mg via ORAL
  Filled 2018-09-10 (×3): qty 1

## 2018-09-10 MED ORDER — BENZOCAINE-MENTHOL 20-0.5 % EX AERO
1.0000 "application " | INHALATION_SPRAY | CUTANEOUS | Status: DC | PRN
  Administered 2018-09-12: 1 via TOPICAL
  Filled 2018-09-10 (×2): qty 56

## 2018-09-10 MED ORDER — HYDRALAZINE HCL 20 MG/ML IJ SOLN: 10.0000 mg | INTRAMUSCULAR | Status: DC | PRN

## 2018-09-10 MED ORDER — BUDESONIDE 0.25 MG/2ML IN SUSP
0.2500 mg | Freq: Two times a day (BID) | RESPIRATORY_TRACT | Status: DC
  Administered 2018-09-11 – 2018-09-12 (×3): 0.25 mg via RESPIRATORY_TRACT
  Filled 2018-09-10 (×6): qty 2

## 2018-09-10 MED ORDER — LABETALOL HCL 5 MG/ML IV SOLN
40.0000 mg | INTRAVENOUS | Status: DC | PRN
  Filled 2018-09-10: qty 8

## 2018-09-10 MED ORDER — AMMONIA AROMATIC IN INHA
RESPIRATORY_TRACT | Status: AC
  Filled 2018-09-10: qty 10

## 2018-09-10 MED ORDER — DIPHENHYDRAMINE HCL 50 MG/ML IJ SOLN
12.5000 mg | INTRAMUSCULAR | Status: DC | PRN
  Administered 2018-09-10: 12.5 mg via INTRAVENOUS
  Filled 2018-09-10: qty 1

## 2018-09-10 MED ORDER — ZOLPIDEM TARTRATE 5 MG PO TABS: 5.0000 mg | ORAL_TABLET | Freq: Every evening | ORAL | Status: DC | PRN

## 2018-09-10 MED ORDER — SODIUM CHLORIDE 0.9% FLUSH: 3.0000 mL | INTRAVENOUS | Status: DC | PRN

## 2018-09-10 MED ORDER — LACTATED RINGERS IV SOLN
INTRAVENOUS | Status: DC
  Administered 2018-09-11: 11:00:00 via INTRAVENOUS

## 2018-09-10 MED ORDER — LIDOCAINE HCL (PF) 1 % IJ SOLN
INTRAMUSCULAR | Status: AC
  Filled 2018-09-10: qty 30

## 2018-09-10 MED ORDER — LIDOCAINE HCL (PF) 1 % IJ SOLN
30.0000 mL | INTRAMUSCULAR | Status: AC | PRN
  Administered 2018-09-10: 1.3 mL via SUBCUTANEOUS

## 2018-09-10 MED ORDER — LACTATED RINGERS IV SOLN: 500.0000 mL | INTRAVENOUS | Status: DC | PRN

## 2018-09-10 MED ORDER — SODIUM CHLORIDE (PF) 0.9 % IJ SOLN
INTRAMUSCULAR | Status: DC | PRN
  Administered 2018-09-10: 12 mL/h via EPIDURAL

## 2018-09-10 MED ORDER — OXYTOCIN 40 UNITS IN NORMAL SALINE INFUSION - SIMPLE MED: 2.5000 [IU]/h | INTRAVENOUS | Status: DC

## 2018-09-10 MED ORDER — ONDANSETRON HCL 4 MG PO TABS: 4.0000 mg | ORAL_TABLET | ORAL | Status: DC | PRN

## 2018-09-10 MED ORDER — TERBUTALINE SULFATE 1 MG/ML IJ SOLN
0.2500 mg | Freq: Once | INTRAMUSCULAR | Status: AC | PRN
  Administered 2018-09-10: 0.25 mg via SUBCUTANEOUS
  Filled 2018-09-10: qty 1

## 2018-09-10 MED ORDER — LIDOCAINE-EPINEPHRINE (PF) 1.5 %-1:200000 IJ SOLN
INTRAMUSCULAR | Status: DC | PRN
  Administered 2018-09-10: 3 mL via EPIDURAL

## 2018-09-10 NOTE — Plan of Care (Signed)
Admission questions asked and admission completed.

## 2018-09-10 NOTE — Anesthesia Procedure Notes (Signed)
Epidural Patient location during procedure: OB Start time: 09/10/2018 5:20 PM End time: 09/10/2018 5:44 PM  Staffing Performed: anesthesiologist   Preanesthetic Checklist Completed: patient identified, site marked, surgical consent, pre-op evaluation, timeout performed, IV checked, risks and benefits discussed and monitors and equipment checked  Epidural Patient position: sitting Prep: Betadine Patient monitoring: heart rate, continuous pulse ox and blood pressure Approach: midline Location: L4-L5 Injection technique: LOR saline  Needle:  Needle type: Tuohy  Needle gauge: 17 G Needle length: 9 cm and 9 Needle insertion depth: 9 cm Catheter type: closed end flexible Catheter size: 20 Guage Catheter at skin depth: 14 cm Test dose: negative and 1.5% lidocaine with Epi 1:200 K  Assessment Events: blood not aspirated, injection not painful, no injection resistance, negative IV test and no paresthesia  Additional Notes   Patient tolerated the insertion well without complications.Reason for block:procedure for pain

## 2018-09-10 NOTE — Discharge Summary (Signed)
Obstetrical Discharge Summary  Patient Name: Dominique Baker DOB: 1985/08/07 MRN: [id number]  Date of Admission: 09/10/2018 Date of Discharge: 09/12/2018  Primary OB:  [facility]   Gestational Age at Delivery: 4865w0d   Antepartum complications: Dominique Baker a [age] y.W.[id number].[id number] female dated by LMP c/w 2483w0d ultrasound.She is scheduled for IOL for IUGR <3%.  Pregnancy Issues: 1.Limited prenatal care, started at [facility] at 22w, [facility] appointment [facility] at 7080w6d 2. Prepregnancy BMI 41 3. Hypertension, chronic vs gestational, on labetalol 300mg  TID 4. History of preterm deliveries at 5-6 months and 7-8 months, declined Makena 5. History of preeclampsiain 2018 pregnancy, required antihypertensives and magnesium 6. 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 Anemiaon iron supplementation 9. Sickle cell trait, FOB has not been tested 10. Fetal growth restriction, <3% on ultrasound 09/08/2018 11.FOB minimally involved, kids are currently in [place] with patient's parents and family visiting for summer. Pt considered termination but was too far along, may consider adoption, was givenresources by [facility].  Admitting Diagnosis: FGR, cHTN Secondary Diagnosis: Patient Active Problem List   Diagnosis Date Noted  . Encounter for planned induction of labor 09/10/2018  . Elevated blood pressure affecting pregnancy in third trimester, antepartum 09/08/2018  . Chronic hypertension in pregnancy 06/16/2018  . Asthma with acute exacerbation? 06/16/2018  . Sickle cell trait in mother affecting pregnancy (HCC) 06/16/2018  . Hx of premature delivery 06/16/2018  . Pre-eclampsia, delivered 01/23/2017  . h/o substance use  10/09/2016    Augmentation: Cytotec and Foley Balloon Complications: None Intrapartum complications/course:  Uncomplicated NSVD Labs normal, blood pressure in mild range but for two severe range taken during contractions but asx and normalized promptly.  She received cytotec and FB. SROM for clear fluid, rapid progression to fully dilated. However, prior to delivery she began having deep late decels which progressively worsened. She dilated quickly and pushed over an intact perineum through two contractions and delivered an immediately vigorous baby. Delayed cord clamping for >60 secs while warming and stimulation, and then placed on maternal perineum. Placenta delivered spontaneously and was intact. 3VC noted. Fundus firm, postpartum pitocin running. Minimal blood loss: QBL pending.  Date of Delivery: 09/10/2018 Delivered By: Christeen DouglasBethany Beasley Delivery Type: spontaneous vaginal delivery Anesthesia: epidural Placenta: Spontaneous Laceration: hemostatic Episiotomy: none Newborn Data: Live born female "Haith"? Birth Weight:   APGAR: 8, 9  Newborn Delivery   Birth date/time: 09/10/2018 19:30:00 Delivery type: Vaginal, Spontaneous       Brief Hospital Course  Dominique Baker is a [id number] who had a SVD on 09/10/2018;  for further details of this delivery, please refer to the delivery note.  Patient had an uncomplicated postpartum course.  By time of discharge on PPD#2, her pain was controlled on oral pain medications; she had appropriate lochia and was ambulating, voiding without difficulty and tolerating regular diet. BP still elevated PP and she remained on Labetalol 300 mg tid. She underwent an uncomplicated PP BTL on 09/11/18.  She was deemed stable for discharge to home.        Discharge Physical Exam: 09/12/2018  BP (!) 144/80 (BP Location: Left Arm)   Pulse 75   Temp 98 F (36.7 C) (Oral)   Resp 18   Ht 5\' 6"  (1.676 m)   Wt 124.3 kg   LMP 12/25/2017   SpO2 100%   BMI 44.22 kg/m   General: NAD CV: RRR Pulm: CTABL, nl effort ABD: s/nd/nt, fundus firm and below the umbilicus Lochia: moderate Incision:  c/d/i  DVT Evaluation: LE non-ttp, no evidence of DVT on exam.  Hemoglobin  Date Value Ref Range Status  09/11/2018 9.5 (L) 12.0 -  15.0 g/dL Final   HGB  Date Value Ref Range Status  05/20/2012 12.3 12.0 - 16.0 g/dL Final   HCT  Date Value Ref Range Status  09/11/2018 28.4 (L) 36.0 - 46.0 % Final  05/20/2012 37.9 35.0 - 47.0 % Final    Post partum course:  Postpartum Procedures: Desires BTL done 09/11/2018 Disposition: stable, discharge to home. Baby Feeding: breastmilk Baby Disposition: home with mom  Rh Immune globulin given:n/a Rubella vaccine given: n/a Tdap vaccine given in AP or PP setting: none documented - will give if not done before   Contraception: BTL  Prenatal Labs: Blood type/Rh --/--/O POS (08/08 0901)  Antibody screen neg  Rubella Immune  Varicella Immune  RPR NR  HBsAg Neg  HIV NR  GC neg  Chlamydia neg  Genetic screening declined  1 hour GTT 124  3 hour GTT n/a  GBS positive     Plan:  Montez Baker was discharged to home in good condition. Follow-up appointment at [facility] with delivering provider in 6 weeks  Discharge Medications: Allergies as of 09/12/2018   No Known Allergies     Medication List    STOP taking these medications   aspirin 81 MG chewable tablet Commonly known as: Aspirin Childrens   ferrous sulfate 324 MG Tbec     TAKE these medications   beclomethasone 40 MCG/ACT inhaler Commonly known as: Qvar RediHaler Inhale 2 puffs into the lungs 2 (two) times daily.   HYDROcodone-acetaminophen 5-325 MG tablet Commonly known as: Norco Take 1 tablet by mouth every 6 (six) hours as needed for moderate pain.   ibuprofen 600 MG tablet Commonly known as: ADVIL Take 1 tablet (600 mg total) by mouth every 6 (six) hours.   labetalol 300 MG tablet Commonly known as: NORMODYNE Take 1 tablet (300 mg total) by mouth 3 (three) times daily.   mometasone 220 MCG/INH inhaler Commonly known as: ASMANEX Inhale 2 puffs into the lungs daily.   Tdap 5-2.5-18.5 LF-MCG/0.5 injection Commonly known as: BOOSTRIX Inject 0.5 mLs into the muscle once  for 1 dose.       Follow-up Information    Benjaman Kindler, MD In 2 weeks.   Specialty: Obstetrics and Gynecology Why: For postop check by video visit, when you call Contact information: [address] [place] [address] [phone]           Signed: Laverta Baltimore MD

## 2018-09-10 NOTE — Anesthesia Preprocedure Evaluation (Signed)
Anesthesia Evaluation  Patient identified by MRN, date of birth, ID band Patient awake    Reviewed: Allergy & Precautions, NPO status , Patient's Chart, lab work & pertinent test results  History of Anesthesia Complications Negative for: history of anesthetic complications  Airway Mallampati: III       Dental   Pulmonary asthma , neg sleep apnea, neg COPD, Not current smoker, former smoker,           Cardiovascular hypertension (PIH), Pt. on medications (-) Past MI and (-) CHF (-) dysrhythmias (-) Valvular Problems/Murmurs     Neuro/Psych neg Seizures    GI/Hepatic Neg liver ROS, neg GERD  ,  Endo/Other  neg diabetesMorbid obesity  Renal/GU negative Renal ROS     Musculoskeletal   Abdominal   Peds  Hematology   Anesthesia Other Findings   Reproductive/Obstetrics                             Anesthesia Physical Anesthesia Plan  ASA: III  Anesthesia Plan: Epidural   Post-op Pain Management:    Induction:   PONV Risk Score and Plan:   Airway Management Planned:   Additional Equipment:   Intra-op Plan:   Post-operative Plan:   Informed Consent: I have reviewed the patients History and Physical, chart, labs and discussed the procedure including the risks, benefits and alternatives for the proposed anesthesia with the patient or authorized representative who has indicated his/her understanding and acceptance.       Plan Discussed with:   Anesthesia Plan Comments:         Anesthesia Quick Evaluation

## 2018-09-10 NOTE — H&P (Signed)
OB ADMISSION/ HISTORY & PHYSICAL:  Admission Date: 09/10/2018  8:19 AM  Admit Diagnosis: Induction  Dominique Baker is a 33 y.o. [id number] presenting for iol for FGR <3% and chronic HTN on labetalol.  Prenatal History: [id number]   EDC : 10/01/2018, Date entered prior to episode creation  Prenatal care at [facility]  Dominique Baker is a 33 y.o. [phone] female dated by LMP c/w [redacted]w[redacted]d ultrasound. She is scheduled for IOL for IUGR <3%.  Pregnancy Issues: 1.Limited prenatal care, started at [facility] at 22w, one appointment at [facility] at 107w6d 2. Prepregnancy BMI 41 3. Hypertension, chronic vs gestational, on labetalol 300mg  TID 4. History of preterm deliveries at 5-6 months and 7-8 months, declined Makena 5. History of preeclampsiain 2018 pregnancy, required antihypertensives and magnesium 6. 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 Anemiaon iron supplementation 9. Sickle cell trait, FOB has not been tested 10. Fetal growth restriction, <3% on ultrasound 09/08/2018 11. FOB minimally involved, kids are currently in [place] with patient's parents and family visiting for summer. Pt considered termination but was too far along, may consider adoption, was given resources by [facility].  Medical / Surgical History :  Past medical history:  Past Medical History:  Diagnosis Date  . Asthma   . Hypertension    Chronic     Past surgical history: History reviewed. No pertinent surgical history.  Family History:  Family History  Problem Relation Age of Onset  . Hypertension Mother   . Diabetes Mother   . Sickle cell anemia Mother   . Sickle cell trait Sister   . Sickle cell trait Brother   . Sickle cell trait Daughter   . Sickle cell trait Son   . Sickle cell trait Sister   . Sickle cell trait Sister   . Sickle cell trait Sister   . Sickle cell trait Sister   . Sickle cell trait Brother   . Sickle cell trait Brother   . Sickle cell trait Son   . Sickle cell trait  Son      Social History:  reports that she quit smoking about 2 years ago. She smoked 0.25 packs per day. She has never used smokeless tobacco. She reports that she does not drink alcohol or use drugs.   Allergies: Patient has no known allergies.    Current Medications at time of admission:  Prior to Admission medications   Medication Sig Start Date End Date Taking? Authorizing Provider  aspirin (ASPIRIN CHILDRENS) 81 MG chewable tablet Chew 1 tablet (81 mg total) by mouth daily. 06/16/18 06/16/19 Yes Jimmey Ralph, MD  beclomethasone (QVAR REDIHALER) 40 MCG/ACT inhaler Inhale 2 puffs into the lungs 2 (two) times daily. 06/16/18  Yes Jimmey Ralph, MD  ferrous sulfate 324 MG TBEC Take 324 mg by mouth.   Yes [provider]  labetalol (NORMODYNE) 300 MG tablet Take 1 tablet (300 mg total) by mouth 3 (three) times daily for 10 days. 09/08/18 09/18/18 Yes Genia Del, [name]  mometasone [facility] 220 MCG/INH inhaler Inhale 2 puffs into the lungs daily.   Yes [provider]     Review of Systems: Active FM No HA No SOB No swelling  Physical Exam:  VS: Blood pressure (!) 146/92, pulse 86, temperature 98.6 F (37 C), temperature source Axillary, resp. rate 16, height 5\' 6"  (1.676 m), weight 124.3 kg, last menstrual period 12/25/2017, SpO2 100 %, unknown if currently breastfeeding.  General: alert and oriented, appears NAD Heart:  RRR Lungs: Clear lung fields Abdomen: Gravid, soft and non-tender, non-distended Extremities: minimal edema  FHT: 135, moderate variability, +accels, no decels TOCO: SVE:  Dilation: 1.5 / Effacement (%): 70 / Station: Ballotable    Cephalic by [employer]  Prenatal Labs: Blood type/Rh --/--/O POS (08/08 0901)  Antibody screen neg  Rubella Immune  Varicella Immune  RPR NR  HBsAg Neg  HIV NR  GC neg  Chlamydia neg  Genetic screening declined  1 hour GTT 124  3 hour GTT n/a  GBS positive   [place] Venous Img Lower  Unilateral Right  Result Date: 09/08/2018 CLINICAL DATA:  Right lower extremity pain and edema. Evaluate for DVT. EXAM: RIGHT LOWER EXTREMITY VENOUS DOPPLER ULTRASOUND TECHNIQUE: Gray-scale sonography with graded compression, as well as color Doppler and duplex ultrasound were performed to evaluate the lower extremity deep venous systems from the level of the common femoral vein and including the common femoral, femoral, profunda femoral, popliteal and calf veins including the posterior tibial, peroneal and gastrocnemius veins when visible. The superficial great saphenous vein was also interrogated. Spectral Doppler was utilized to evaluate flow at rest and with distal augmentation maneuvers in the common femoral, femoral and popliteal veins. COMPARISON:  None. FINDINGS: Contralateral Common Femoral Vein: Respiratory phasicity is normal and symmetric with the symptomatic side. No evidence of thrombus. Normal compressibility. Common Femoral Vein: Rouleaux flow was demonstrated within right common femoral vein however there is no evidence of thrombus. Normal compressibility, respiratory phasicity and response to augmentation. Saphenofemoral Junction: No evidence of thrombus. Normal compressibility and flow on color Doppler imaging. Profunda Femoral Vein: No evidence of thrombus. Normal compressibility and flow on color Doppler imaging. Femoral Vein: No evidence of thrombus. Normal compressibility, respiratory phasicity and response to augmentation. Popliteal Vein: No evidence of thrombus. Normal compressibility, respiratory phasicity and response to augmentation. Calf Veins: No evidence of thrombus. Normal compressibility and flow on color Doppler imaging. Superficial Great Saphenous Vein: No evidence of thrombus. Normal compressibility. Venous Reflux:  None. Other Findings:  None IMPRESSION: No evidence of DVT within the right lower extremity. Electronically Signed   By: Simonne ComeJohn  Watts M.D.   On: 09/08/2018 11:58    [place] Mfm Fetal Bpp Wo Non Stress  Result Date: 09/08/2018 ----------------------------------------------------------------------  OBSTETRICS REPORT                       (Signed Final 09/08/2018 09:09 am) ---------------------------------------------------------------------- PATIENT INFO:  ID #:       [id number]                          D.O.B.:  1985-08-01 (32 yrs)  Name:       Dominique Baker                Visit Date: 09/08/2018 08:46 am ---------------------------------------------------------------------- PERFORMED BY:  Performed By:     Baron HamperMeagan Hickernell      Ref. Address:     [address]                                                             [address],  [place], [place]                                                             [phone]  Referred By:      Alberteen SpindleELIZABETH A                    SCIORA [name] ---------------------------------------------------------------------- SERVICE(S) PROVIDED:   US MFM FETAL BPP WO NON STRESS                       [phone]  ---------------------------------------------------------------------- INDICATIONS:   [redacted] weeks gestation of pregnancy                Z3A.36   Increased BMI   IUGR   Chronic HTN  ---------------------------------------------------------------------- FETAL EVALUATION:  Num Of Fetuses:         1  Fetal Heart Rate(bpm):  133  Cardiac Activity:       Present  Presentation:           Cephalic  Placenta:               Anterior, No previa doc previously  Amniotic Fluid  AFI FV:      Within normal limits  AFI Sum(cm)     %Tile       Largest Pocket(cm)  11.9            37          4.15 ---------------------------------------------------------------------- BIOPHYSICAL EVALUATION:  Amniotic F.V:   Within normal limits       F. Tone:        Observed  F. Movement:    Observed                   Score:          8/8  F. Breathing:   Observed ----------------------------------------------------------------------  BIOMETRY:  BPD:      75.1  mm     G. Age:  30w 1d        < 1  %    CI:        71.77   %    70 - 86                                                          FL/HC:      23.1   %    20.8 - 22.6  HC:      282.2  mm     G. Age:  30w 6d        < 3  %    HC/AC:      0.98        0.92 - 1.05  AC:      289.1  mm     G. Age:  32w 6d        < 3  %    FL/BPD:     87.0   %    71 - 87  FL:       65.3  mm     G. Age:  33w  5d        < 3  %    FL/AC:      22.6   %    20 - 24  HUM:      58.8  mm     G. Age:  34w 0d         19  %  Est. FW:    2023  gm      4 lb 7 oz    < 3  % ---------------------------------------------------------------------- GESTATIONAL AGE:  LMP:           36w 5d        Date:  12/25/17                 EDD:   10/01/18  U/S Today:     31w 6d                                        EDD:   11/04/18  Best:          36w 5d     Det. By:  LMP  (12/25/17)          EDD:   10/01/18 ---------------------------------------------------------------------- ANATOMY:  Cavum:                 Visualized             Stomach:                Seen                         previously  Ventricles:            Normal appearance      Abdominal Wall:         Visualized                                                                        previously  Cerebellum:            Visualized             Cord Vessels:           3 vessels,                         previously                                     visualized previously  Posterior Fossa:       Visualized             Kidneys:                Normal appearance                         previously  Face:                  Orbits visualized      Bladder:  Seen                         previously  Lips:                  Visualized             Spine:                  Visualized                         previously                                     previously  Heart:                 Suboptimal             Upper Extremities:      Visualized                                                                         previously  RVOT:                  Suboptimal             Lower Extremities:      Visualized                                                                        previously  LVOT:                  Suboptimal  Other:  Ao/Pa Suboptimal ---------------------------------------------------------------------- DOPPLER - FETAL VESSELS:  Umbilical Artery   S/D     %tile  2.15       36 ---------------------------------------------------------------------- IMPRESSION:  Intrauterine pregnancy with a best estimated gestational age  of [redacted] weeks 5 days.  Dating is based on earliest available  ultrasound performed at [facility] on 04/21/2018;  measurements were reported as 16 weeks 6 days.  Follow up ultrasound performed to assess growth.  Due to late gestational age and fetal position, heart views  were suboptimally visualized but have been documented  previously.  Other fetal anatomy views appear normal or have  been documented previously.  EFW is <3rd percentile with symmetric growth restriction.  Head circumference is lagging by about 6 weeks, measuring  between 2 and 3 SD below the mean for gestational age.  Mean umbilical cord Dopplers s/d ratio is 2.15 which is  normal for 36 weeks.  BPP 8/8.  Findings were discussed.  Ms. Sharlet SalinaBenjamin has an elevated BP  today (148/94) despite taking her medication this morning.  She also reports increasing lower extremity edema which is  becoming more severe (appears to be pitting edema 1+ up to  the level of her knees).  She apparently called last  week  regarding concerning symptoms but didn't follow through with  recommendation to be evaluated on L&D.  Given elevated BP, edema and severe growth restriction, will  send to L&D for evaluation of preeclampsia.  In the absence  of preeclampsia, recommend delivery at 37 weeks given  EFW <3rd percentile. ----------------------------------------------------------------------                  Kirby Funk, MD Electronically  Signed Final Report   09/08/2018 09:09 am ----------------------------------------------------------------------   Assessment: [redacted]w[redacted]d weeks gestation Fetal growth restriction FHR category 1   Plan:  Labs pending Epidural when desired Continuous fetal monitoring   1. Fetal Well being  - Fetal Tracing: Cat I - Ultrasound:  reviewed, as above - Group B Streptococcus: positive- abx ordered - Presentation: vtx confirmed by [employer]   2. Routine OB: - Prenatal labs reviewed, as above - Rh pos  3. Post Partum Planning: - Infant feeding: breast - Contraception: BTL or Depo  4. Induction of Labor:  -  Contractions external toco in place -  Plan for induction with cytotec - largest prior delivery for 6#7oz

## 2018-09-11 ENCOUNTER — Inpatient Hospital Stay: Payer: Medicaid Other | Admitting: Anesthesiology

## 2018-09-11 ENCOUNTER — Encounter: Payer: Self-pay | Admitting: Obstetrics and Gynecology

## 2018-09-11 ENCOUNTER — Encounter
Admission: EM | Disposition: A | Discharge status: Home / Self Care | Payer: Self-pay | Source: Home / Self Care | Attending: Obstetrics and Gynecology

## 2018-09-11 HISTORY — PX: TUBAL LIGATION: SHX77

## 2018-09-11 LAB — CBC
HCT: 28.4 % — ABNORMAL LOW (ref 36.0–46.0)
Hemoglobin: 9.5 g/dL — ABNORMAL LOW (ref 12.0–15.0)
MCH: 27.9 pg (ref 26.0–34.0)
MCHC: 33.5 g/dL (ref 30.0–36.0)
MCV: 83.3 fL (ref 80.0–100.0)
Platelets: 139 10*3/uL — ABNORMAL LOW (ref 150–400)
RBC: 3.41 MIL/uL — ABNORMAL LOW (ref 3.87–5.11)
RDW: 14.4 % (ref 11.5–15.5)
WBC: 9.5 10*3/uL (ref 4.0–10.5)
nRBC: 0 % (ref 0.0–0.2)

## 2018-09-11 SURGERY — LIGATION, FALLOPIAN TUBE, POSTPARTUM
Anesthesia: General | Laterality: Bilateral

## 2018-09-11 MED ORDER — SUCCINYLCHOLINE CHLORIDE 20 MG/ML IJ SOLN
INTRAMUSCULAR | Status: AC
  Filled 2018-09-11: qty 1

## 2018-09-11 MED ORDER — ONDANSETRON HCL 4 MG/2ML IJ SOLN
INTRAMUSCULAR | Status: DC | PRN
  Administered 2018-09-11: 4 mg via INTRAVENOUS

## 2018-09-11 MED ORDER — LIDOCAINE HCL (CARDIAC) PF 100 MG/5ML IV SOSY
PREFILLED_SYRINGE | INTRAVENOUS | Status: DC | PRN
  Administered 2018-09-11: 60 mg via INTRAVENOUS

## 2018-09-11 MED ORDER — FENTANYL CITRATE (PF) 100 MCG/2ML IJ SOLN
INTRAMUSCULAR | Status: AC
  Filled 2018-09-11: qty 2

## 2018-09-11 MED ORDER — DEXAMETHASONE SODIUM PHOSPHATE 10 MG/ML IJ SOLN
INTRAMUSCULAR | Status: DC | PRN
  Administered 2018-09-11: 10 mg via INTRAVENOUS

## 2018-09-11 MED ORDER — ROCURONIUM BROMIDE 50 MG/5ML IV SOLN
INTRAVENOUS | Status: AC
  Filled 2018-09-11: qty 1

## 2018-09-11 MED ORDER — ONDANSETRON HCL 4 MG/2ML IJ SOLN
INTRAMUSCULAR | Status: AC
  Filled 2018-09-11: qty 2

## 2018-09-11 MED ORDER — ONDANSETRON HCL 4 MG/2ML IJ SOLN: 4.0000 mg | Freq: Once | INTRAMUSCULAR | Status: DC | PRN

## 2018-09-11 MED ORDER — LABETALOL HCL 100 MG PO TABS
300.0000 mg | ORAL_TABLET | Freq: Three times a day (TID) | ORAL | Status: DC
  Administered 2018-09-11 – 2018-09-12 (×5): 300 mg via ORAL
  Filled 2018-09-11 (×5): qty 3

## 2018-09-11 MED ORDER — FENTANYL CITRATE (PF) 100 MCG/2ML IJ SOLN
INTRAMUSCULAR | Status: DC | PRN
  Administered 2018-09-11 (×2): 50 ug via INTRAVENOUS
  Administered 2018-09-11: 100 ug via INTRAVENOUS

## 2018-09-11 MED ORDER — SODIUM CHLORIDE (PF) 0.9 % IJ SOLN
INTRAMUSCULAR | Status: AC
  Filled 2018-09-11: qty 10

## 2018-09-11 MED ORDER — SUGAMMADEX SODIUM 500 MG/5ML IV SOLN
INTRAVENOUS | Status: DC | PRN
  Administered 2018-09-11: 250 mg via INTRAVENOUS

## 2018-09-11 MED ORDER — DEXAMETHASONE SODIUM PHOSPHATE 10 MG/ML IJ SOLN
INTRAMUSCULAR | Status: AC
  Filled 2018-09-11: qty 1

## 2018-09-11 MED ORDER — BUPIVACAINE HCL 0.5 % IJ SOLN
INTRAMUSCULAR | Status: DC | PRN
  Administered 2018-09-11: 9 mL
  Administered 2018-09-11: 3 mL

## 2018-09-11 MED ORDER — SUGAMMADEX SODIUM 500 MG/5ML IV SOLN
INTRAVENOUS | Status: AC
  Filled 2018-09-11: qty 5

## 2018-09-11 MED ORDER — ROCURONIUM BROMIDE 100 MG/10ML IV SOLN
INTRAVENOUS | Status: DC | PRN
  Administered 2018-09-11: 40 mg via INTRAVENOUS

## 2018-09-11 MED ORDER — PROPOFOL 10 MG/ML IV BOLUS
INTRAVENOUS | Status: AC
  Filled 2018-09-11: qty 20

## 2018-09-11 MED ORDER — LACTATED RINGERS IV SOLN
INTRAVENOUS | Status: DC
  Administered 2018-09-11: 50 mL/h via INTRAVENOUS

## 2018-09-11 MED ORDER — FENTANYL CITRATE (PF) 100 MCG/2ML IJ SOLN
25.0000 ug | INTRAMUSCULAR | Status: AC | PRN
  Administered 2018-09-11 (×6): 25 ug via INTRAVENOUS

## 2018-09-11 MED ORDER — PROPOFOL 10 MG/ML IV BOLUS
INTRAVENOUS | Status: DC | PRN
  Administered 2018-09-11: 150 mg via INTRAVENOUS

## 2018-09-11 MED ORDER — BUPIVACAINE HCL (PF) 0.5 % IJ SOLN
INTRAMUSCULAR | Status: AC
  Filled 2018-09-11: qty 30

## 2018-09-11 MED ORDER — LIDOCAINE HCL (PF) 2 % IJ SOLN
INTRAMUSCULAR | Status: AC
  Filled 2018-09-11: qty 10

## 2018-09-11 SURGICAL SUPPLY — 34 items
BLADE SURG SZ11 CARB STEEL (BLADE) ×3 IMPLANT
CANISTER SUCT 1200ML W/VALVE (MISCELLANEOUS) ×3 IMPLANT
CHLORAPREP W/TINT 26 (MISCELLANEOUS) ×3 IMPLANT
COVER WAND RF STERILE (DRAPES) ×3 IMPLANT
DERMABOND ADVANCED (GAUZE/BANDAGES/DRESSINGS) ×2
DERMABOND ADVANCED .7 DNX12 (GAUZE/BANDAGES/DRESSINGS) ×1 IMPLANT
DRAPE LAPAROTOMY 100X77 ABD (DRAPES) ×3 IMPLANT
DRSG TEGADERM 2-3/8X2-3/4 SM (GAUZE/BANDAGES/DRESSINGS) ×3 IMPLANT
ELECT REM PT RETURN 9FT ADLT (ELECTROSURGICAL) ×3
ELECTRODE REM PT RTRN 9FT ADLT (ELECTROSURGICAL) ×1 IMPLANT
GLOVE BIO SURGEON STRL SZ7 (GLOVE) ×3 IMPLANT
GLOVE INDICATOR 7.5 STRL GRN (GLOVE) ×3 IMPLANT
GOWN STRL REUS W/ TWL LRG LVL3 (GOWN DISPOSABLE) ×1 IMPLANT
GOWN STRL REUS W/TWL LRG LVL3 (GOWN DISPOSABLE) ×2
KIT TURNOVER CYSTO (KITS) ×3 IMPLANT
LABEL OR SOLS (LABEL) ×3 IMPLANT
LIGASURE IMPACT 36 18CM CVD LR (INSTRUMENTS) ×3 IMPLANT
NEEDLE HYPO 22GX1.5 SAFETY (NEEDLE) ×3 IMPLANT
NS IRRIG 500ML POUR BTL (IV SOLUTION) ×3 IMPLANT
PACK BASIN MINOR ARMC (MISCELLANEOUS) ×3 IMPLANT
PAD OB MATERNITY 4.3X12.25 (PERSONAL CARE ITEMS) ×3 IMPLANT
RETRACTOR WOUND ALXS 18CM SML (MISCELLANEOUS) ×1 IMPLANT
RTRCTR WOUND ALEXIS O 18CM SML (MISCELLANEOUS) ×3
SPONGE GAUZE 2X2 8PLY STER LF (GAUZE/BANDAGES/DRESSINGS) ×1
SPONGE GAUZE 2X2 8PLY STRL LF (GAUZE/BANDAGES/DRESSINGS) ×2 IMPLANT
SPONGE LAP 4X18 RFD (DISPOSABLE) ×3 IMPLANT
SUT CHROMIC GUT BROWN 0 54 (SUTURE) ×1 IMPLANT
SUT CHROMIC GUT BROWN 0 54IN (SUTURE) ×3
SUT MNCRL 4-0 (SUTURE) ×2
SUT MNCRL 4-0 27XMFL (SUTURE) ×1
SUT VIC AB 0 CT2 27 (SUTURE) ×3 IMPLANT
SUT VICRYL 0 AB UR-6 (SUTURE) ×6 IMPLANT
SUTURE MNCRL 4-0 27XMF (SUTURE) ×1 IMPLANT
SYR 10ML LL (SYRINGE) ×3 IMPLANT

## 2018-09-11 NOTE — Transfer of Care (Signed)
Immediate Anesthesia Transfer of Care Note  Patient: Dominique Baker  Procedure(s) Performed: POST PARTUM TUBAL LIGATION (N/A )  Patient Location: PACU  Anesthesia Type:General  Level of Consciousness: drowsy and patient cooperative  Airway & Oxygen Therapy: Patient Spontanous Breathing and Patient connected to face mask oxygen  Post-op Assessment: Report given to RN and Post -op Vital signs reviewed and stable  Post vital signs: Reviewed and stable  Last Vitals:  Vitals Value Taken Time  BP 129/77 09/11/18 1153  Temp 36.2 C 09/11/18 1151  Pulse 86 09/11/18 1153  Resp 19 09/11/18 1153  SpO2 100 % 09/11/18 1153  Vitals shown include unvalidated device data.  Last Pain:  Vitals:   09/11/18 1151  TempSrc:   PainSc: Asleep         Complications: No apparent anesthesia complications

## 2018-09-11 NOTE — Op Note (Signed)
Dominique Baker 09/11/2018  PREOPERATIVE DIAGNOSES: Multiparity, undesired fertility  POSTOPERATIVE DIAGNOSES: Multiparity, undesired fertility  PROCEDURE:  Postpartum Bilateral Tubal Sterilization by bilateral partial salpingectomy, including fimbriae  SURGEON: Dr. Benjaman Kindler  ANESTHESIA:  GETA and local analgesia using 20 ml of 0.5% Marcaine  Anesthesiologist: Gunnar Fusi, MD Anesthesiologist: Gunnar Fusi, MD CRNA: Jonna Clark, CRNA  COMPLICATIONS:  None immediate.  ESTIMATED BLOOD LOSS: minimal.  FLUIDS: see anesthesia notes.  URINE OUTPUT:  Not assessed  INDICATIONS:  33 y.o. [id number] with undesired fertility,status post vaginal delivery, desires permanent sterilization.  Other reversible forms of contraception were discussed with patient; she declines all other modalities. Risks of procedure discussed with patient including but not limited to: risk of regret, permanence of method, bleeding, infection, injury to surrounding organs and need for additional procedures.  Failure risk of 1 -2 % with increased risk of ectopic gestation if pregnancy occurs was also discussed with patient.      FINDINGS:  Normal uterus, tubes, and ovaries.  PROCEDURE DETAILS:  The patient was taken to the operating room where her epidural anesthesia was dosed up to surgical level and found to be adequate. She was then placed in the dorsal supine position and prepped and draped in sterile fashion. After an adequate timeout was performed, attention was turned to the patient's abdomen where a small transverse skin incision was made under the umbilical fold. The incision was taken down to the layer of fascia using the scalpel, and fascia was incised, and extended bilaterally using Mayo scissors. The peritoneum was entered in a sharp fashion. Attention was then turned to the patient's uterus, and left fallopian tube was identified and followed out to the fimbriated end. Using a Ligasure  bipolar cautery device, a portion of the left tube including the fimbriated end was cauterized and sharply excised. A similar process was carried out on the right side allowing for bilateral tubal sterilization. Good hemostasis was noted overall. Local analgesia was poured over both adenexa.The instruments were then removed from the patient's abdomen and the fascial incision was repaired with 0 Vicryl, and the skin was closed with a 4-0 Vicryl subcuticular stitch. The patient tolerated the procedure well. Instrument, sponge, and needle counts were correct times two. The patient was then taken to the recovery room awake and in stable condition.

## 2018-09-11 NOTE — Anesthesia Preprocedure Evaluation (Signed)
Anesthesia Evaluation  Patient identified by MRN, date of birth, ID band Patient awake    Reviewed: Allergy & Precautions, NPO status , Patient's Chart, lab work & pertinent test results  History of Anesthesia Complications Negative for: history of anesthetic complications  Airway Mallampati: III       Dental   Pulmonary asthma , neg sleep apnea, neg COPD, Not current smoker, former smoker,           Cardiovascular hypertension, Pt. on medications (-) Past MI and (-) CHF (-) dysrhythmias (-) Valvular Problems/Murmurs     Neuro/Psych neg Seizures    GI/Hepatic Neg liver ROS, neg GERD  ,  Endo/Other  neg diabetesMorbid obesity  Renal/GU negative Renal ROS     Musculoskeletal   Abdominal   Peds  Hematology   Anesthesia Other Findings   Reproductive/Obstetrics                             Anesthesia Physical  Anesthesia Plan  ASA: III  Anesthesia Plan: General   Post-op Pain Management:    Induction:   PONV Risk Score and Plan: 3 and Dexamethasone, Ondansetron and Midazolam  Airway Management Planned: Oral ETT  Additional Equipment:   Intra-op Plan:   Post-operative Plan:   Informed Consent: I have reviewed the patients History and Physical, chart, labs and discussed the procedure including the risks, benefits and alternatives for the proposed anesthesia with the patient or authorized representative who has indicated his/her understanding and acceptance.       Plan Discussed with:   Anesthesia Plan Comments:         Anesthesia Quick Evaluation

## 2018-09-11 NOTE — Anesthesia Post-op Follow-up Note (Signed)
Anesthesia QCDR form completed.        

## 2018-09-11 NOTE — Anesthesia Postprocedure Evaluation (Signed)
Anesthesia Post Note  Patient: Dominique Baker  Procedure(s) Performed: AN AD [place] INTUBATION  Patient location during evaluation: Mother Baby Anesthesia Type: Epidural Level of consciousness: awake and alert Pain management: pain level controlled Vital Signs Assessment: post-procedure vital signs reviewed and stable Respiratory status: spontaneous breathing, nonlabored ventilation and respiratory function stable Cardiovascular status: stable Postop Assessment: no headache, no backache and epidural receding Anesthetic complications: no     Last Vitals:  Vitals:   09/11/18 0810 09/11/18 0826  BP:  133/84  Pulse:  69  Resp:  18  Temp:  36.9 C  SpO2: 96% 100%    Last Pain:  Vitals:   09/11/18 1000  TempSrc:   PainSc: 0-No pain                 KEPHART,WILLIAM K

## 2018-09-11 NOTE — Anesthesia Postprocedure Evaluation (Deleted)
Anesthesia Post Note  Patient: Dominique Baker  Procedure(s) Performed: POST PARTUM TUBAL LIGATION (N/A )  Patient location during evaluation: Mother Baby Anesthesia Type: General Level of consciousness: awake and alert Pain management: pain level controlled Vital Signs Assessment: post-procedure vital signs reviewed and stable Respiratory status: spontaneous breathing, nonlabored ventilation and respiratory function stable Cardiovascular status: stable Postop Assessment: no headache, no backache and epidural receding Anesthetic complications: no     Last Vitals:  Vitals:   09/11/18 0810 09/11/18 0826  BP:  133/84  Pulse:  69  Resp:  18  Temp:  36.9 C  SpO2: 96% 100%    Last Pain:  Vitals:   09/11/18 1000  TempSrc:   PainSc: 0-No pain                 Kyani Simkin K

## 2018-09-11 NOTE — Anesthesia Postprocedure Evaluation (Signed)
Anesthesia Post Note  Patient: Dominique Baker  Procedure(s) Performed: POST PARTUM TUBAL LIGATION (Bilateral )  Patient location during evaluation: PACU Anesthesia Type: General Level of consciousness: awake and alert Pain management: pain level controlled Vital Signs Assessment: post-procedure vital signs reviewed and stable Respiratory status: spontaneous breathing and respiratory function stable Cardiovascular status: stable Anesthetic complications: no     Last Vitals:  Vitals:   09/11/18 1240 09/11/18 1245  BP: (!) 146/90 (!) 146/100  Pulse: 67 74  Resp: 13 19  Temp:    SpO2: 96% 96%    Last Pain:  Vitals:   09/11/18 1300  TempSrc:   PainSc: 4                  Khyli Swaim K

## 2018-09-11 NOTE — Progress Notes (Signed)
Post Partum Day 1 Subjective: no complaints, up ad lib and voiding  Objective: Blood pressure 133/84, pulse 69, temperature 98.4 F (36.9 C), temperature source Oral, resp. rate 18, height 5\' 6"  (1.676 m), weight 124.3 kg, last menstrual period 12/25/2017, SpO2 100 %, unknown if currently breastfeeding.  Physical Exam:  General: alert and cooperative Lochia: appropriate Uterine Fundus: firm DVT Evaluation: No evidence of DVT seen on physical exam.  Recent Labs    09/10/18 0902 09/11/18 0558  HGB 10.3* 9.5*  HCT 31.0* 28.4*    Assessment/Plan: Plan for discharge tomorrow and Circumcision prior to discharge  -Patient desires permanent sterilization.  Other reversible forms of contraception were discussed with patient; she declines all other modalities. Risks of procedure discussed with patient including but not limited to: risk of regret, permanence of method, bleeding, infection, injury to surrounding organs and need for additional procedures.  Failure risk of 1-2 % with increased risk of ectopic gestation if pregnancy occurs was also discussed with patient.  Patient verbalized understanding of these risks and wants to proceed with sterilization.  Written informed consent obtained.  To OR when ready.  Tubal ligation    LOS: 1 day   Benjaman Kindler 09/11/2018, 10:15 AM

## 2018-09-11 NOTE — Anesthesia Procedure Notes (Signed)
Procedure Name: Intubation Date/Time: 09/11/2018 10:49 AM Performed by: Jonna Clark, CRNA Pre-anesthesia Checklist: Patient identified, Patient being monitored, Timeout performed, Emergency Drugs available and Suction available Patient Re-evaluated:Patient Re-evaluated prior to induction Oxygen Delivery Method: Circle system utilized Preoxygenation: Pre-oxygenation with 100% oxygen Induction Type: IV induction Ventilation: Mask ventilation without difficulty Laryngoscope Size: Mac and 3 Grade View: Grade I Tube type: Oral Tube size: 7.0 mm Number of attempts: 1 Airway Equipment and Method: Stylet Placement Confirmation: ETT inserted through vocal cords under direct vision,  positive ETCO2 and breath sounds checked- equal and bilateral Secured at: 21 cm Tube secured with: Tape Dental Injury: Teeth and Oropharynx as per pre-operative assessment

## 2018-09-12 ENCOUNTER — Telehealth: Payer: Self-pay | Admitting: Licensed Clinical Social Worker

## 2018-09-12 MED ORDER — IBUPROFEN 600 MG PO TABS: 600.0000 mg | ORAL_TABLET | Freq: Four times a day (QID) | ORAL | 0 refills | Status: DC

## 2018-09-12 MED ORDER — LABETALOL HCL 300 MG PO TABS: 300.0000 mg | ORAL_TABLET | Freq: Three times a day (TID) | ORAL | 1 refills | Status: DC

## 2018-09-12 MED ORDER — TETANUS-DIPHTH-ACELL PERTUSSIS 5-2.5-18.5 LF-MCG/0.5 IM SUSP
0.5000 mL | Freq: Once | INTRAMUSCULAR | Status: AC
  Administered 2018-09-12: 0.5 mL via INTRAMUSCULAR
  Filled 2018-09-12: qty 0.5

## 2018-09-12 MED ORDER — HYDROCODONE-ACETAMINOPHEN 5-325 MG PO TABS: 1.0000 | ORAL_TABLET | Freq: Four times a day (QID) | ORAL | 0 refills | Status: DC | PRN

## 2018-09-12 MED ORDER — TETANUS-DIPHTH-ACELL PERTUSSIS 5-2.5-18.5 LF-MCG/0.5 IM SUSP: 0.5000 mL | Freq: Once | INTRAMUSCULAR | 0 refills | Status: AC

## 2018-09-12 NOTE — Telephone Encounter (Signed)
Spoke with patient. Conducted brief assessment, and provided brief Psychoeducation on postpartum mood and anxiety disorders. Patient voiced that she was still in the hospital and requested LCSW to call back in a few days to discuss scheduling an appointment.

## 2018-09-12 NOTE — Discharge Instructions (Signed)
Please call your doctor or return to the ER if you experience any chest pains, shortness of breath, dizziness, visual changes, severe headache (unrelieved by pain meds), fever greater than 101, any heavy bleeding (saturating more than 1 pad per hour), large clots, or foul smelling discharge, any worsening abdominal pain and cramping that is not controlled by pain medication, any breast concerns (pain/redness), any leg/calf pain or redness, or any signs of postpartum depression. No tampons, enemas, douches, or sexual intercourse for 6 weeks. Also avoid tub baths, hot tubs, or swimming for 6 weeks.

## 2018-09-12 NOTE — Clinical Social Work Maternal (Signed)
  CLINICAL SOCIAL WORK MATERNAL/CHILD NOTE  Patient Details  Name: Dominique Baker MRN: [id number] Date of Birth: 22-Oct-1985  Date:  09/12/2018  Clinical Social Worker Initiating Note:  [employer], LCSW Date/Time: Initiated:  09/12/18/1551     Child's Name:      Biological Parents:  Mother, Father   Need for Interpreter:  None   Reason for Referral:  Other (Comment)(scored 9 on depression screen.)   Address:  [place] [address]    Phone number:  [phone] (home)     Additional phone number:  Household Members/Support Persons (HM/SP):       HM/SP Name Relationship DOB or Age  HM/SP -1        HM/SP -2        HM/SP -3        HM/SP -4        HM/SP -5        HM/SP -6        HM/SP -7        HM/SP -8          Natural Supports (not living in the home):  Parent, Immediate Family, Extended Family   Professional Supports:     Employment: Homemaker   Type of Work:     Education:  Programmer, systems   Homebound arranged:    Museum/gallery curator Resources:  Medicaid   Other Resources:  [facility]   Cultural/Religious Considerations Which May Impact Care:    Strengths:  Ability to meet basic needs    Psychotropic Medications:         Pediatrician:       Pediatrician List:   [place]      Pediatrician Fax Number:    Risk Factors/Current Problems:  None   Cognitive State:  Alert , Insightful    Mood/Affect:  Calm , Happy    CSW Assessment: Clinical Education officer, museum (CSW) received consult that mother scored a 9 on depression screen. Per RN mother is bonding with baby and there are no concerns. CSW met with mother and father of the infant was at bedside. CSW introduced self and explained role of CSW department. Per mother this is her 5th baby and her children are ages 51, 109, 35 and 1. Per mother she lives in [place] with all of her children.  Mother reported that she is happy to bring infant home. Mother reported that she has [facility] and Medicaid. Mother reported that she has all the supplies needed for infant including a car seat. Mother reported that she is not feeling depressed or anxious. Mother reported that she is not having thoughts of hurting herself. Mother reported that she did not have postpartum depression or postpartum anxiety with any of her other children. Mother reported that she does not use drugs or drink alcohol. CSW provided mother with a list of outpatient mental health clinics including [place], [facility] and [facility] women's clinic. Mother accepted resources and reported no other needs or concerns. Pleaae reconsult if future social work needs arise. CSW signing off.    CSW Plan/Description:  No Further Intervention Required/No Barriers to Discharge    Dominique Baker, Lenice Llamas 09/12/2018, 3:53 PM

## 2018-09-12 NOTE — Progress Notes (Signed)
Discharge order received from doctor.Tdap vaccine given at discharge. Reviewed discharge instructions and prescriptions with patient and answered all questions. Follow up appointment instructions given. Patient verbalized understanding. ID bands checked. Patient discharged home with infant via wheelchair by nursing/auxillary.    Hilbert Bible, RN

## 2018-09-12 NOTE — Lactation Note (Signed)
This note was copied from a baby's chart. Lactation Consultation Note  Patient Name: Boy Annaya Bangert NTIRW'E Date: 09/12/2018 Reason for consult: Follow-up assessment  Mom is breastfeeding for about 20 minutes and offering formula afterwards.  She iterated that she is not feeding him more than 95mL at each follow-up feed.  Mom feels like she is not producing enough milk for her baby and likes the safety net of formula.  [facility] intern informed mom to offer formula only when feeding cues were present after offering the breast.  Mom was told about milk supply's supply and demand model, breastfeeding frequency, engorgement, diaper output frequency, outpatient appointments, and virtual support groups.  Mom is returning to work soon and wishes to bottle feed both formula and breastmilk.  Mom was told to contact her insurance for a pump and to join our breastfeeding group for support.  Maternal Data Formula Feeding for Exclusion: Yes Reason for exclusion: Mother's choice to formula and breast feed on admission Has patient been taught Hand Expression?: Yes Does the patient have breastfeeding experience prior to this delivery?: Yes  Feeding Feeding Type: Bottle Fed - Formula Nipple Type: Slow - flow  LATCH Score                   Interventions    Lactation Tools Discussed/Used     Consult Status Consult Status: Complete    Lavonia Drafts 09/12/2018, 10:43 AM

## 2018-09-12 NOTE — Telephone Encounter (Signed)
Patient referred by Kieth Brightly, MSW, OBCM due to symptoms of depression. Jonette Eva also reported that the patient is currently pregnant and will deliver in the next few weeks.

## 2018-09-13 LAB — SURGICAL PATHOLOGY

## 2018-09-15 NOTE — Telephone Encounter (Signed)
Spoke with patient. Patient declined to schedule appointment at this time. LCSW encouraged patient to call, if she needs an appointment in the future.

## 2018-09-20 DIAGNOSIS — F411 Generalized anxiety disorder: Secondary | ICD-10-CM | POA: Diagnosis not present

## 2018-09-20 DIAGNOSIS — I10 Essential (primary) hypertension: Secondary | ICD-10-CM | POA: Diagnosis not present

## 2018-09-20 DIAGNOSIS — J4541 Moderate persistent asthma with (acute) exacerbation: Secondary | ICD-10-CM | POA: Diagnosis not present

## 2018-09-27 DIAGNOSIS — I10 Essential (primary) hypertension: Secondary | ICD-10-CM | POA: Diagnosis not present

## 2018-09-27 DIAGNOSIS — J45998 Other asthma: Secondary | ICD-10-CM | POA: Diagnosis not present

## 2018-09-27 DIAGNOSIS — E782 Mixed hyperlipidemia: Secondary | ICD-10-CM | POA: Diagnosis not present

## 2018-09-27 DIAGNOSIS — J301 Allergic rhinitis due to pollen: Secondary | ICD-10-CM | POA: Diagnosis not present

## 2018-12-22 DIAGNOSIS — U071 COVID-19: Secondary | ICD-10-CM | POA: Diagnosis not present

## 2018-12-22 DIAGNOSIS — Z20828 Contact with and (suspected) exposure to other viral communicable diseases: Secondary | ICD-10-CM | POA: Diagnosis not present

## 2019-01-19 DIAGNOSIS — Z20828 Contact with and (suspected) exposure to other viral communicable diseases: Secondary | ICD-10-CM | POA: Diagnosis not present

## 2019-01-19 DIAGNOSIS — U071 COVID-19: Secondary | ICD-10-CM | POA: Diagnosis not present

## 2019-02-06 DIAGNOSIS — U071 COVID-19: Secondary | ICD-10-CM | POA: Diagnosis not present

## 2019-02-06 DIAGNOSIS — Z20828 Contact with and (suspected) exposure to other viral communicable diseases: Secondary | ICD-10-CM | POA: Diagnosis not present

## 2019-02-13 DIAGNOSIS — Z20828 Contact with and (suspected) exposure to other viral communicable diseases: Secondary | ICD-10-CM | POA: Diagnosis not present

## 2019-02-13 DIAGNOSIS — U071 COVID-19: Secondary | ICD-10-CM | POA: Diagnosis not present

## 2019-02-27 DIAGNOSIS — Z20828 Contact with and (suspected) exposure to other viral communicable diseases: Secondary | ICD-10-CM | POA: Diagnosis not present

## 2019-02-27 DIAGNOSIS — U071 COVID-19: Secondary | ICD-10-CM | POA: Diagnosis not present

## 2019-03-06 DIAGNOSIS — U071 COVID-19: Secondary | ICD-10-CM | POA: Diagnosis not present

## 2019-03-06 DIAGNOSIS — Z20828 Contact with and (suspected) exposure to other viral communicable diseases: Secondary | ICD-10-CM | POA: Diagnosis not present

## 2019-03-13 DIAGNOSIS — Z20828 Contact with and (suspected) exposure to other viral communicable diseases: Secondary | ICD-10-CM | POA: Diagnosis not present

## 2019-03-13 DIAGNOSIS — U071 COVID-19: Secondary | ICD-10-CM | POA: Diagnosis not present

## 2019-03-21 DIAGNOSIS — Z23 Encounter for immunization: Secondary | ICD-10-CM | POA: Diagnosis not present

## 2019-03-27 DIAGNOSIS — Z20828 Contact with and (suspected) exposure to other viral communicable diseases: Secondary | ICD-10-CM | POA: Diagnosis not present

## 2019-03-27 DIAGNOSIS — U071 COVID-19: Secondary | ICD-10-CM | POA: Diagnosis not present

## 2019-04-10 DIAGNOSIS — U071 COVID-19: Secondary | ICD-10-CM | POA: Diagnosis not present

## 2019-04-24 DIAGNOSIS — U071 COVID-19: Secondary | ICD-10-CM | POA: Diagnosis not present

## 2019-07-24 ENCOUNTER — Telehealth: Payer: Self-pay

## 2019-07-24 NOTE — Telephone Encounter (Signed)
You  Emani, [place] [phone]  1 year ago   Rx for QVAR was rejected by [facility] requiring Pre-authorization.. I faxed authorization to [facility] for Dr. Alvester Morin to call Rx in today . Spoke with Ulyess Blossom about this.Left a message for the pt as well.   Outgoing call

## 2019-09-21 DIAGNOSIS — J019 Acute sinusitis, unspecified: Secondary | ICD-10-CM | POA: Diagnosis not present

## 2019-09-21 DIAGNOSIS — J4541 Moderate persistent asthma with (acute) exacerbation: Secondary | ICD-10-CM | POA: Diagnosis not present

## 2019-09-21 DIAGNOSIS — F411 Generalized anxiety disorder: Secondary | ICD-10-CM | POA: Diagnosis not present

## 2019-09-21 DIAGNOSIS — I10 Essential (primary) hypertension: Secondary | ICD-10-CM | POA: Diagnosis not present

## 2019-10-05 ENCOUNTER — Telehealth: Payer: Self-pay

## 2020-03-05 DIAGNOSIS — F321 Major depressive disorder, single episode, moderate: Secondary | ICD-10-CM | POA: Diagnosis not present

## 2020-03-05 DIAGNOSIS — J019 Acute sinusitis, unspecified: Secondary | ICD-10-CM | POA: Diagnosis not present

## 2020-03-05 DIAGNOSIS — R7302 Impaired glucose tolerance (oral): Secondary | ICD-10-CM | POA: Diagnosis not present

## 2020-03-05 DIAGNOSIS — N390 Urinary tract infection, site not specified: Secondary | ICD-10-CM | POA: Diagnosis not present

## 2020-03-05 DIAGNOSIS — E785 Hyperlipidemia, unspecified: Secondary | ICD-10-CM | POA: Diagnosis not present

## 2020-03-05 DIAGNOSIS — R799 Abnormal finding of blood chemistry, unspecified: Secondary | ICD-10-CM | POA: Diagnosis not present

## 2020-03-05 DIAGNOSIS — J4541 Moderate persistent asthma with (acute) exacerbation: Secondary | ICD-10-CM | POA: Diagnosis not present

## 2020-03-05 DIAGNOSIS — F411 Generalized anxiety disorder: Secondary | ICD-10-CM | POA: Diagnosis not present

## 2020-11-19 ENCOUNTER — Emergency Department
Admission: EM | Admit: 2020-11-19 | Discharge: 2020-11-19 | Disposition: A | Discharge status: Home / Self Care | Payer: Medicaid Other | Attending: Emergency Medicine | Admitting: Emergency Medicine

## 2020-11-19 ENCOUNTER — Encounter: Payer: Self-pay | Admitting: Emergency Medicine

## 2020-11-19 ENCOUNTER — Other Ambulatory Visit: Payer: Self-pay

## 2020-11-19 DIAGNOSIS — Y908 Blood alcohol level of 240 mg/100 ml or more: Secondary | ICD-10-CM | POA: Diagnosis not present

## 2020-11-19 DIAGNOSIS — Z7951 Long term (current) use of inhaled steroids: Secondary | ICD-10-CM | POA: Diagnosis not present

## 2020-11-19 DIAGNOSIS — F10120 Alcohol abuse with intoxication, uncomplicated: Secondary | ICD-10-CM | POA: Insufficient documentation

## 2020-11-19 DIAGNOSIS — Z79899 Other long term (current) drug therapy: Secondary | ICD-10-CM | POA: Diagnosis not present

## 2020-11-19 DIAGNOSIS — Z87891 Personal history of nicotine dependence: Secondary | ICD-10-CM | POA: Insufficient documentation

## 2020-11-19 DIAGNOSIS — J45909 Unspecified asthma, uncomplicated: Secondary | ICD-10-CM | POA: Insufficient documentation

## 2020-11-19 DIAGNOSIS — I10 Essential (primary) hypertension: Secondary | ICD-10-CM | POA: Diagnosis not present

## 2020-11-19 DIAGNOSIS — F10920 Alcohol use, unspecified with intoxication, uncomplicated: Secondary | ICD-10-CM

## 2020-11-19 DIAGNOSIS — R45851 Suicidal ideations: Secondary | ICD-10-CM | POA: Insufficient documentation

## 2020-11-19 DIAGNOSIS — Z046 Encounter for general psychiatric examination, requested by authority: Secondary | ICD-10-CM | POA: Diagnosis present

## 2020-11-19 DIAGNOSIS — F32A Depression, unspecified: Secondary | ICD-10-CM | POA: Diagnosis not present

## 2020-11-19 LAB — ETHANOL: Alcohol, Ethyl (B): 291 mg/dL — ABNORMAL HIGH (ref <10)

## 2020-11-19 LAB — ACETAMINOPHEN LEVEL: Acetaminophen (Tylenol), Serum: <10 ug/mL — ABNORMAL LOW (ref 10–30)

## 2020-11-19 LAB — BASIC METABOLIC PANEL
Anion gap: 10 (ref 5–15)
BUN: 11 mg/dL (ref 6–20)
CO2: 24 mmol/L (ref 22–32)
Calcium: 9.1 mg/dL (ref 8.9–10.3)
Chloride: 111 mmol/L (ref 98–111)
Creatinine, Ser: 0.93 mg/dL (ref 0.44–1.00)
GFR, Estimated: >60 mL/min (ref >60)
Glucose, Bld: 103 mg/dL — ABNORMAL HIGH (ref 70–99)
Potassium: 3.5 mmol/L (ref 3.5–5.1)
Sodium: 145 mmol/L (ref 135–145)

## 2020-11-19 LAB — CBC
HCT: 37.9 % (ref 36.0–46.0)
Hemoglobin: 12.8 g/dL (ref 12.0–15.0)
MCH: 26.7 pg (ref 26.0–34.0)
MCHC: 33.8 g/dL (ref 30.0–36.0)
MCV: 79 fL — ABNORMAL LOW (ref 80.0–100.0)
Platelets: 270 10*3/uL (ref 150–400)
RBC: 4.8 MIL/uL (ref 3.87–5.11)
RDW: 15.4 % (ref 11.5–15.5)
WBC: 10 10*3/uL (ref 4.0–10.5)
nRBC: 0 % (ref 0.0–0.2)

## 2020-11-19 LAB — SALICYLATE LEVEL: Salicylate Lvl: <7 mg/dL — ABNORMAL LOW (ref 7.0–30.0)

## 2020-11-19 MED ORDER — LABETALOL HCL 200 MG PO TABS
300.0000 mg | ORAL_TABLET | Freq: Three times a day (TID) | ORAL | Status: DC
  Administered 2020-11-19: 300 mg via ORAL
  Filled 2020-11-19 (×3): qty 1

## 2020-11-19 MED ORDER — HALOPERIDOL LACTATE 5 MG/ML IJ SOLN
5.0000 mg | Freq: Once | INTRAMUSCULAR | Status: DC
  Filled 2020-11-19: qty 1

## 2020-11-19 MED ORDER — LORAZEPAM 2 MG/ML IJ SOLN
2.0000 mg | Freq: Once | INTRAMUSCULAR | Status: DC
  Filled 2020-11-19: qty 1

## 2020-11-19 MED ORDER — DIPHENHYDRAMINE HCL 50 MG/ML IJ SOLN
25.0000 mg | Freq: Once | INTRAMUSCULAR | Status: DC
  Filled 2020-11-19: qty 1

## 2020-11-19 MED ORDER — LABETALOL HCL 100 MG PO TABS: 100.0000 mg | ORAL_TABLET | Freq: Two times a day (BID) | ORAL | 1 refills | Status: DC

## 2020-11-19 MED ORDER — ACETAMINOPHEN 325 MG PO TABS
650.0000 mg | ORAL_TABLET | Freq: Once | ORAL | Status: AC
  Administered 2020-11-19: 650 mg via ORAL
  Filled 2020-11-19: qty 2

## 2020-11-19 MED ORDER — LABETALOL HCL 300 MG PO TABS: 300.0000 mg | ORAL_TABLET | Freq: Three times a day (TID) | ORAL | 1 refills | Status: DC

## 2020-11-19 NOTE — ED Notes (Signed)
Security at bedside attempting to reason with patient and encourage patient to dress into hospital provided scrubs.

## 2020-11-19 NOTE — ED Notes (Addendum)
Pt placed her earrings, sweatshirt and work badge into belonging bag. RN spoke with EDP provider regarding that pt had on her green work scrubs and her shoes. Verbal permission for patient to keep green scrubs on at this time. Pt gave shoes and placed them in labeled belonging bag with the rest of her belongings. Pt has been wanded for security. Will re-evaluate after being seen by psychiatrist. Verbal orders to hold IM medications at this time.

## 2020-11-19 NOTE — ED Provider Notes (Signed)
[facility] Emergency Department Provider Note   ____________________________________________   Event Date/Time   First MD Initiated Contact with Patient 11/19/20 [phone]     (approximate)  I have reviewed the triage vital signs and the nursing notes.   HISTORY  Chief Complaint Mental Health Problem    HPI Desteni Piscopo is a 35 y.o. female brought to the ED by parents and PD under IVC for intoxication and suicidal ideation.  Patient hostile but not aggressive.  Uncooperative.  Voices no medical complaints.      Past Medical History:  Diagnosis Date   Asthma    Hypertension    Chronic    Patient Active Problem List   Diagnosis Date Noted   Encounter for planned induction of labor 09/10/2018   Elevated blood pressure affecting pregnancy in third trimester, antepartum 09/08/2018   Chronic hypertension in pregnancy 06/16/2018   Asthma with acute exacerbation? 06/16/2018   Sickle cell trait in mother affecting pregnancy (HCC) 06/16/2018   Hx of premature delivery 06/16/2018   Pre-eclampsia, delivered 01/23/2017   h/o substance use  10/09/2016    Past Surgical History:  Procedure Laterality Date   TUBAL LIGATION Bilateral 09/11/2018   Procedure: POST PARTUM TUBAL LIGATION;  Surgeon: Christeen Douglas, MD;  Location: [facility] ORS;  Service: Gynecology;  Laterality: Bilateral;    Prior to Admission medications   Medication Sig Start Date End Date Taking? Authorizing Provider  beclomethasone (QVAR REDIHALER) 40 MCG/ACT inhaler Inhale 2 puffs into the lungs 2 (two) times daily. 06/16/18   Jimmey Ralph, MD  HYDROcodone-acetaminophen [facility]) 5-325 MG tablet Take 1 tablet by mouth every 6 (six) hours as needed for moderate pain. 09/12/18   Schermerhorn, Ihor Austin, MD  ibuprofen (ADVIL) 600 MG tablet Take 1 tablet (600 mg total) by mouth every 6 (six) hours. 09/12/18   Schermerhorn, Ihor Austin, MD  labetalol (NORMODYNE) 300 MG tablet Take 1 tablet (300 mg  total) by mouth 3 (three) times daily. 09/12/18   Schermerhorn, Ihor Austin, MD  mometasone [facility]) 220 MCG/INH inhaler Inhale 2 puffs into the lungs daily.    [provider]    Allergies Patient has no known allergies.  Family History  Problem Relation Age of Onset   Hypertension Mother    Diabetes Mother    Sickle cell anemia Mother    Sickle cell trait Sister    Sickle cell trait Brother    Sickle cell trait Daughter    Sickle cell trait Son    Sickle cell trait Sister    Sickle cell trait Sister    Sickle cell trait Sister    Sickle cell trait Sister    Sickle cell trait Brother    Sickle cell trait Brother    Sickle cell trait Son    Sickle cell trait Son     Social History Social History   Tobacco Use   Smoking status: Former    Packs/day: 0.25    Types: Cigarettes    Quit date: 07/08/2016    Years since quitting: 4.3   Smokeless tobacco: Never  Vaping Use   Vaping Use: Never used  Substance Use Topics   Alcohol use: No   Drug use: No    Review of Systems  Constitutional: No fever/chills Eyes: No visual changes. ENT: No sore throat. Cardiovascular: Denies chest pain. Respiratory: Denies shortness of breath. Gastrointestinal: No abdominal pain.  No nausea, no vomiting.  No diarrhea.  No constipation. Genitourinary: Negative for dysuria. Musculoskeletal: Negative  for back pain. Skin: Negative for rash. Neurological: Negative for headaches, focal weakness or numbness. Psychiatric: Positive for alcohol intoxication with suicidal ideation.   ____________________________________________   PHYSICAL EXAM:  VITAL SIGNS: ED Triage Vitals  Enc Vitals Group     BP 11/19/20 0351 (!) 201/64     Pulse Rate 11/19/20 0351 (!) 108     Resp 11/19/20 0351 20     Temp 11/19/20 0351 98 F (36.7 C)     Temp Source 11/19/20 0351 Oral     SpO2 11/19/20 0351 95 %     Weight 11/19/20 0350 288 lb (130.6 kg)     Height 11/19/20 0350 5\' 6"  (1.676 m)     Head  Circumference --      Peak Flow --      Pain Score 11/19/20 0350 0     Pain Loc --      Pain Edu? --      Excl. in GC? --     Constitutional: Alert and oriented. Well appearing and in no acute distress. Eyes: Conjunctivae are normal. PERRL. EOMI. Head: Atraumatic. Nose: No congestion/rhinnorhea. Mouth/Throat: Mucous membranes are moist.   Neck: No stridor.   Cardiovascular: Normal rate, regular rhythm. Grossly normal heart sounds.  Good peripheral circulation. Respiratory: Normal respiratory effort.  No retractions. Lungs CTAB. Gastrointestinal: Soft and nontender. No distention. No abdominal bruits. No CVA tenderness. Musculoskeletal: No lower extremity tenderness nor edema.  No joint effusions. Neurologic:  Normal speech and language. No gross focal neurologic deficits are appreciated. No gait instability. Skin:  Skin is warm, dry and intact. No rash noted. Psychiatric: Mood and affect are hostile, uncooperative. Speech and behavior are normal.  ____________________________________________   LABS (all labs ordered are listed, but only abnormal results are displayed)  Labs Reviewed  CBC - Abnormal; Notable for the following components:      Result Value   MCV 79.0 (*)    All other components within normal limits  BASIC METABOLIC PANEL - Abnormal; Notable for the following components:   Glucose, Bld 103 (*)    All other components within normal limits  ETHANOL - Abnormal; Notable for the following components:   Alcohol, Ethyl (B) 291 (*)    All other components within normal limits  SALICYLATE LEVEL - Abnormal; Notable for the following components:   Salicylate Lvl <7.0 (*)    All other components within normal limits  ACETAMINOPHEN LEVEL - Abnormal; Notable for the following components:   Acetaminophen (Tylenol), Serum <10 (*)    All other components within normal limits  URINE DRUG SCREEN, QUALITATIVE ([facility] ONLY)  URINALYSIS, ROUTINE W REFLEX MICROSCOPIC  POC URINE  PREG, ED   ____________________________________________  EKG  None ____________________________________________  RADIOLOGY I, Ceola Para J, personally viewed and evaluated these images (plain radiographs) as part of my medical decision making, as well as reviewing the written report by the radiologist.  ED MD interpretation: None  Official radiology report(s): No results found.  ____________________________________________   PROCEDURES  Procedure(s) performed (including Critical Care):  Procedures   ____________________________________________   INITIAL IMPRESSION / ASSESSMENT AND PLAN / ED COURSE  As part of my medical decision making, I reviewed the following data within the electronic MEDICAL RECORD NUMBER Nursing notes reviewed and incorporated, Labs reviewed, A consult was requested and obtained from this/these consultant(s) Psychiatry, and Notes from prior ED visits     35 year old female brought to the ED under IVC for intoxication with suicidal ideation. The patient has been  placed in psychiatric observation due to the need to provide a safe environment for the patient while obtaining psychiatric consultation and evaluation, as well as ongoing medical and medication management to treat the patient's condition.  The patient has been placed under full IVC at this time.       ____________________________________________   FINAL CLINICAL IMPRESSION(S) / ED DIAGNOSES  Final diagnoses:  Alcoholic intoxication without complication (HCC)  Depression, unspecified depression type     ED Discharge Orders     None        Note:  This document was prepared using Dragon voice recognition software and may include unintentional dictation errors.    Irean Hong, MD 11/19/20 [phone]

## 2020-11-19 NOTE — Discharge Instructions (Addendum)
Please return as needed.  Follow-up with your primary care doctor in the next few days to make sure your blood pressure is doing well.  I have written you a prescription for some labetalol.  It is slightly lower dose than what you were taking before.  Please take it twice a day until such time as you see your doctor.  If you cannot see your doctor within the next 2 days please follow-up and get your blood pressure done at an urgent care/walk-in or at the emergency squad or return here for blood pressure check.

## 2020-11-19 NOTE — ED Notes (Signed)
IVC  PAPERS  RESCINDED  PER  DR  CLAPACS  MD 

## 2020-11-19 NOTE — ED Notes (Addendum)
Charge RN Erie Noe at bedside to explain process of IVC, and the protocol of requiring changing into hospital provided clothing for patient's own safety-- with patient and showed patient copy of affidavit as patient requested. Pt adamantly refusing to change into hospital scrubs and states then "I guess we are going to fight then". Dr. Dolores Frame made aware. Security at bedside.

## 2020-11-19 NOTE — ED Provider Notes (Addendum)
Patient has been cleared by psychiatry we will let her go.  She apparently was not taking her Normodyne and did not get it last night so her blood pressure is now quite high.  We will let her go after giving her her labetalol and restarting her on it.  I will have her follow-up with her primary care doctor.   Arnaldo Natal, MD 11/19/20 1108 Patient confirms that she takes labetalol when she thinks her blood pressure is high and not every day as she supposed to.  I discussed with her the importance of taking labetalol every day because her blood pressure is very very high and could in the future lead to a stroke or heart attack.  She says she understands.  Since she is not taking it regularly and she is not sure if she has any at home I have written her 100 twice a day of labetalol to start her on that if need be we can increase it later.  If she still has the medicine at home she can resume her regular dose as long as she has taken it recently which she says she has.   Arnaldo Natal, MD 11/19/20 1114 I should say which she says she has without adverse effect.   Arnaldo Natal, MD 11/19/20 1115

## 2020-11-19 NOTE — ED Notes (Signed)
Pt discharged home. Discharge teaching done and she verbalized understanding. Pt signed discharge paper form. Given all her personal belongings. Escorted to lobby at discharge, ambulatory with steady gait, stable condition and in NAD.

## 2020-11-19 NOTE — ED Triage Notes (Signed)
Patient ambulatory to triage with steady gait, without difficulty or distress noted; pt in custody of [place] [employer] for IVC; pt denies SI or HI or hx of same

## 2020-11-19 NOTE — ED Notes (Signed)
IVC, pending consult 

## 2020-11-19 NOTE — ED Notes (Signed)
Patient was given breakfast tray with juice. Patient is still sleeping and doesn't want to be bothered at this time.

## 2020-11-20 ENCOUNTER — Telehealth: Payer: Self-pay

## 2020-11-20 NOTE — Telephone Encounter (Signed)
Transition Care Management Follow-up Telephone Call Date of discharge and from where: 11/19/2020-[facility] How have you been since you were released from the hospital? Patient stated she is doing fine.  Any questions or concerns? No  Items Reviewed: Did the pt receive and understand the discharge instructions provided? Yes  Medications obtained and verified?  No medications given at discharge Other? No  Any new allergies since your discharge? No  Dietary orders reviewed? No Do you have support at home? Yes   Home Care and Equipment/Supplies: Were home health services ordered? not applicable If so, what is the name of the agency? N/A  Has the agency set up a time to come to the patient's home? not applicable Were any new equipment or medical supplies ordered?  No What is the name of the medical supply agency? N/A Were you able to get the supplies/equipment? not applicable Do you have any questions related to the use of the equipment or supplies? No  Functional Questionnaire: (I = Independent and D = Dependent) ADLs: I  Bathing/Dressing- I  Meal Prep- I  Eating- I  Maintaining continence- I  Transferring/Ambulation- I  Managing Meds- I  Follow up appointments reviewed:  PCP Hospital f/u appt confirmed? No   Specialist Hospital f/u appt confirmed? No   Are transportation arrangements needed? No  If their condition worsens, is the pt aware to call PCP or go to the Emergency Dept.? Yes Was the patient provided with contact information for the PCP's office or ED? Yes Was to pt encouraged to call back with questions or concerns? Yes

## 2021-01-09 IMAGING — DX PORTABLE CHEST - 1 VIEW
1 series · 1 of 1 positions shown · non-contrast
Comparison: None.

CLINICAL DATA: Cough, shortness of breath.

EXAM:
PORTABLE CHEST 1 VIEW

[chest ap]
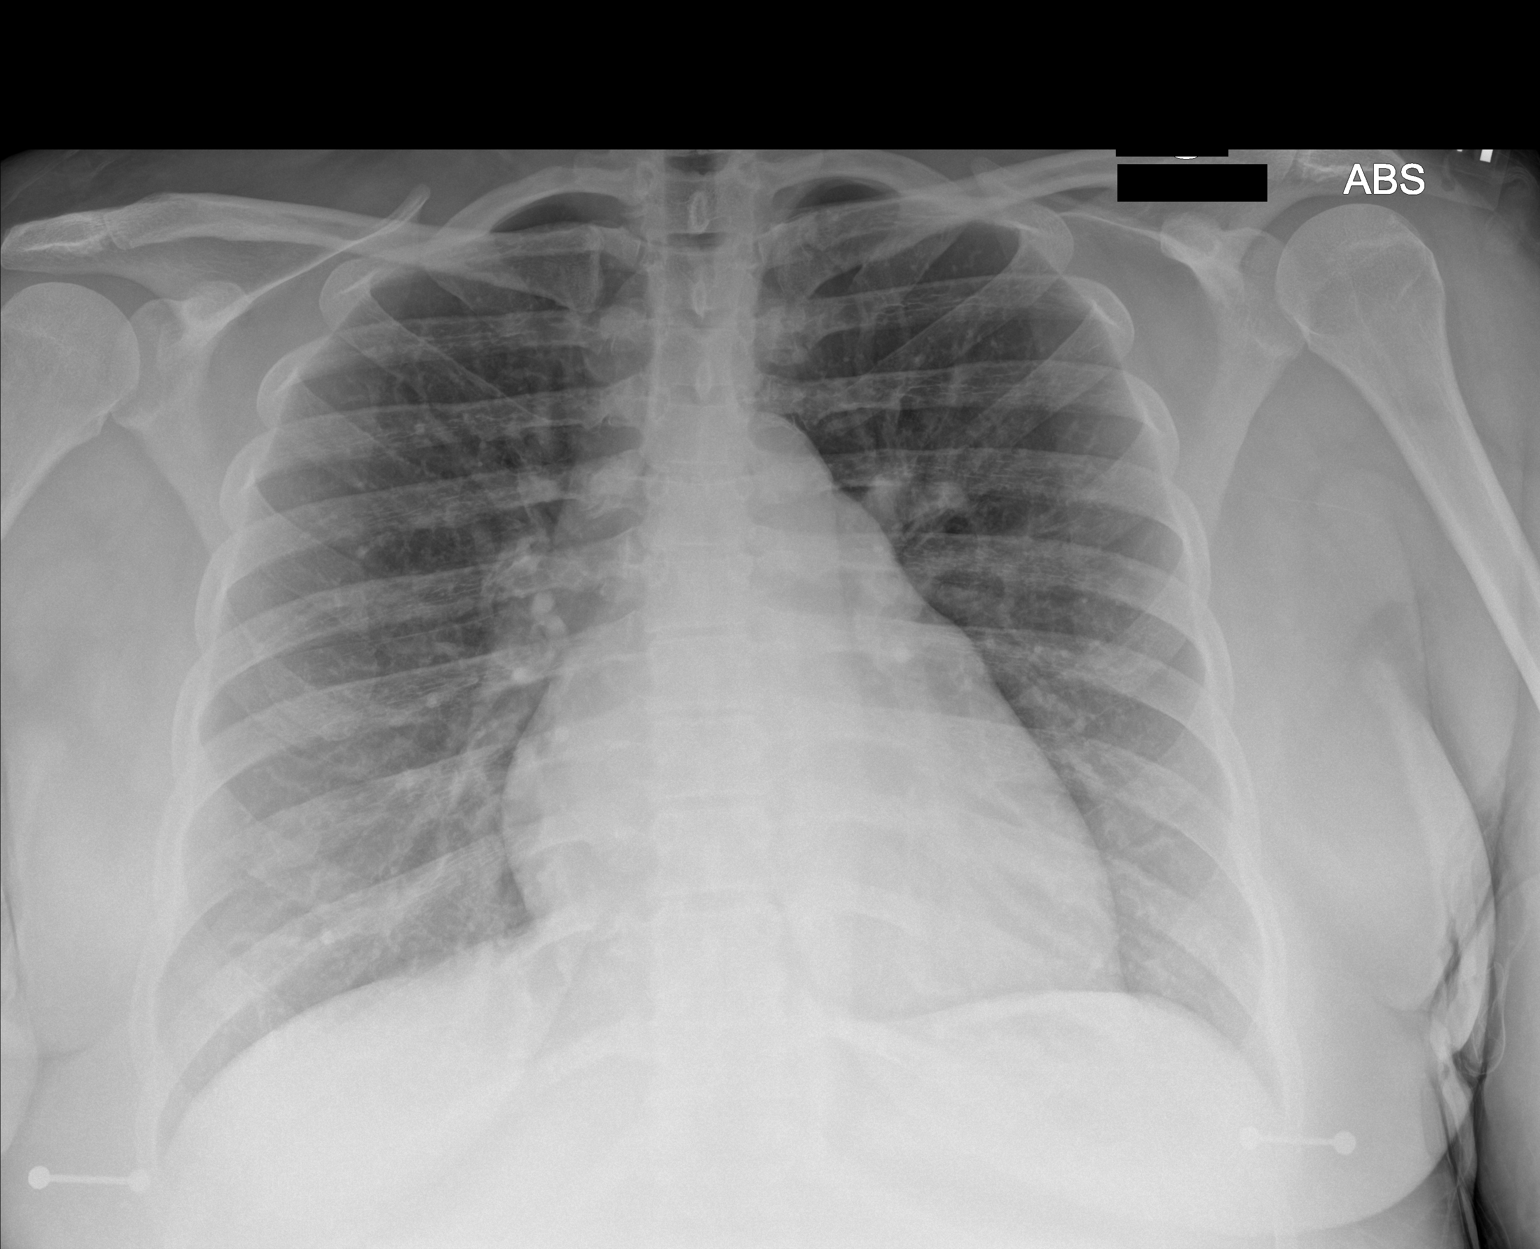

[1 of 1 positions shown; findings below may reference images not displayed]

FINDINGS: The heart size and mediastinal contours are within normal limits.
Both lungs are clear. No pneumothorax or pleural effusion is noted.
The visualized skeletal structures are unremarkable.
IMPRESSION: No active disease.

## 2021-02-07 ENCOUNTER — Telehealth: Payer: Self-pay | Admitting: Internal Medicine

## 2021-02-07 NOTE — Telephone Encounter (Signed)
.. °  Medicaid Managed Care   Unsuccessful Outreach Note  02/07/2021 Name: Dominique Baker MRN: [id number] DOB: 03/11/1985  Referred by: Perrin Maltese, MD Reason for referral : High Risk Managed Medicaid (I called the patient today to get her scheduled with the MM Team. I left my name and number on her VM.)   An unsuccessful telephone outreach was attempted today. The patient was referred to the case management team for assistance with care management and care coordination.   Follow Up Plan: The care management team will reach out to the patient again over the next 7 days.   [place]

## 2021-02-15 IMAGING — US US MFM OB FOLLOW UP
1 series · 12 of 28 positions shown · non-contrast
Comparison: none

PATIENT INFO:

PERFORMED BY:
                   Sonographer
                   ONG [name]
SERVICE(S) PROVIDED:
 ----------------------------------------------------------------------
INDICATIONS:
  28 weeks gestation of pregnancy
  Obesity (BMI 44)
  Hypertension
  History of preeclampsia
  Sickle cell trait
  History of preterm delivery x 2
FETAL EVALUATION:
 Num Of Fetuses:         1
 Fetal Heart Rate(bpm):  136
 Cardiac Activity:       Present
 Presentation:           Breech
 Placenta:               Anterior, No previa [REDACTED] previously
 Amniotic Fluid
 AFI FV:      Within normal limits
 AFI Sum(cm)     %Tile
 12.6            34
BIOMETRY:
 BPD:      69.2  mm     G. Age:  27w 5d         14  %    CI:        74.18   %    70 - 86
                                                         FL/HC:      20.8   %    19.6 -
 HC:      255.1  mm     G. Age:  27w 5d          4  %    HC/AC:      1.08        0.99 -
 AC:      236.3  mm     G. Age:  28w 0d         22  %    FL/BPD:     76.7   %    71 - 87
 FL:       53.1  mm     G. Age:  28w 2d         22  %    FL/AC:      22.5   %    20 - 24
 HUM:        47  mm     G. Age:  27w 5d         23  %
 Est. FW:    8895  gm      2 lb 9 oz     20  %
GESTATIONAL AGE:
 LMP:           28w 5d        Date:  12/25/17                 EDD:   10/01/18
 U/S Today:     28w 0d                                        EDD:   10/06/18
 Best:          28w 5d     Det. By:  LMP  (12/25/17)          EDD:   10/01/18
ANATOMY:
 Cavum:                 Visualized             Stomach:                Seen
                        previously
 Ventricles:            Normal appearance      Abdominal Wall:         Visualized
                                                                       previously
 Cerebellum:            Visualized             Cord Vessels:           3 vessels,
                        previously                                     visualized previously
 Posterior Fossa:       Visualized             Kidneys:                Normal appearance
 Face:                  Orbits visualized      Bladder:                Seen
 Lips:                  Visualized             Spine:                  Visualized
                        previously                                     previously
 Heart:                 4-Chamber view         Upper Extremities:      Visualized
                        appears normal                                 previously
 RVOT:                  Normal appearance      Lower Extremities:      Visualized
 LVOT:                  Normal appearance
 Other:  Ao/Pa normal appearance

[Series 1: us mfm ob follow up · 0.22mm/px · 12 of 53 slices shown]
[im 2/53]
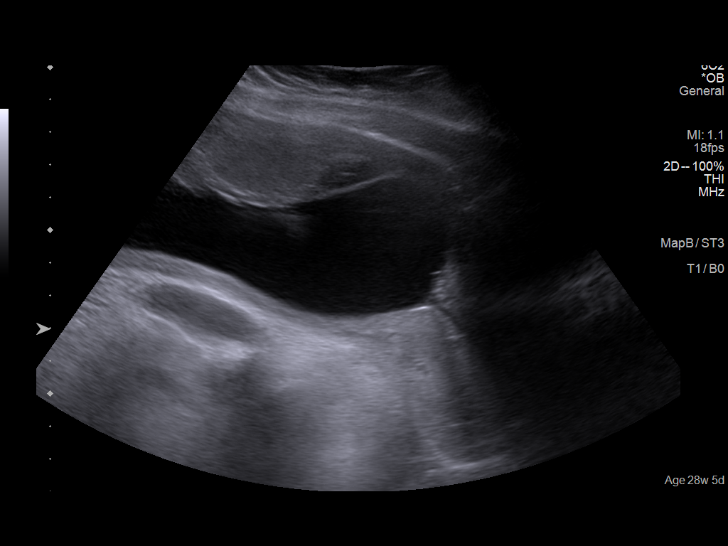
[im 6/53]
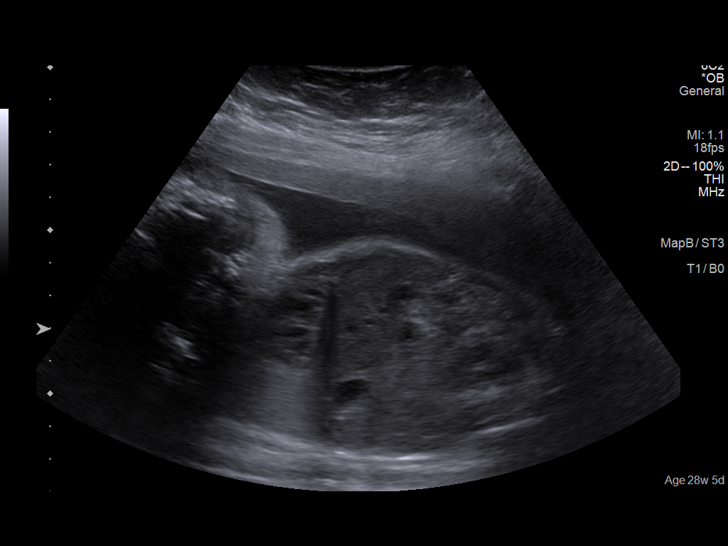
[im 10/53]
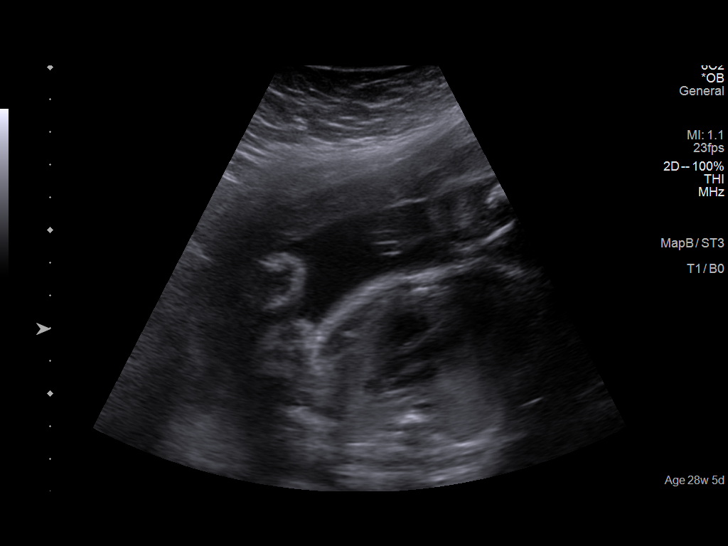
[im 16/53]
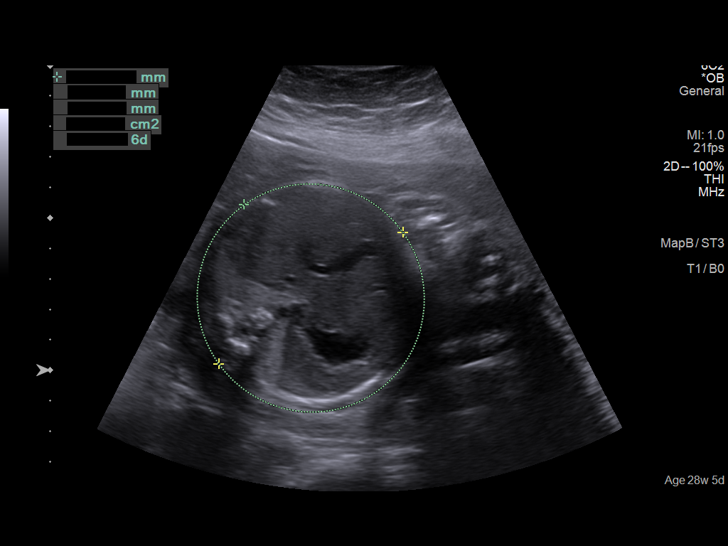
[im 20/53]
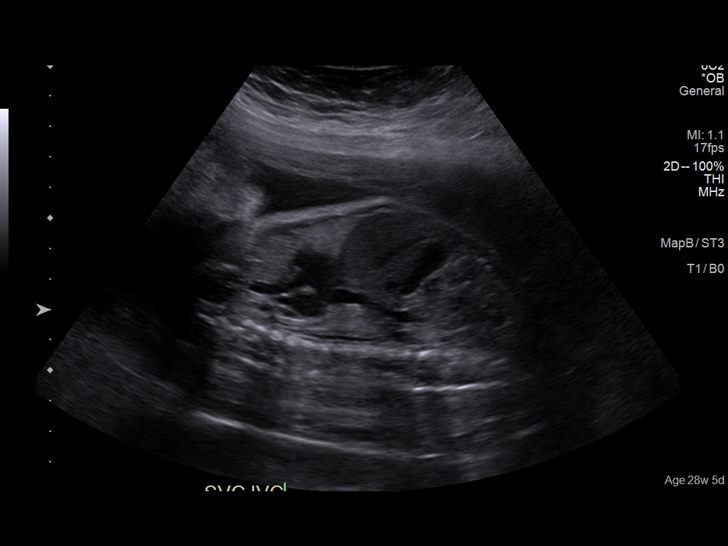
[im 24/53]
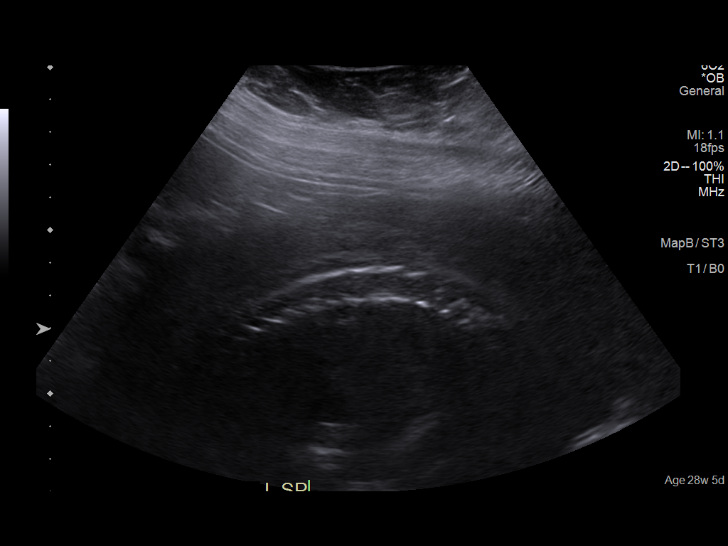
[im 29/53]
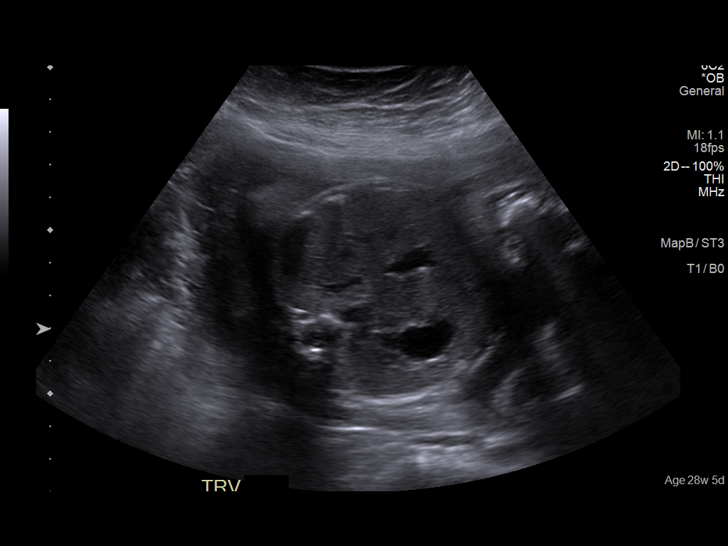
[im 33/53]
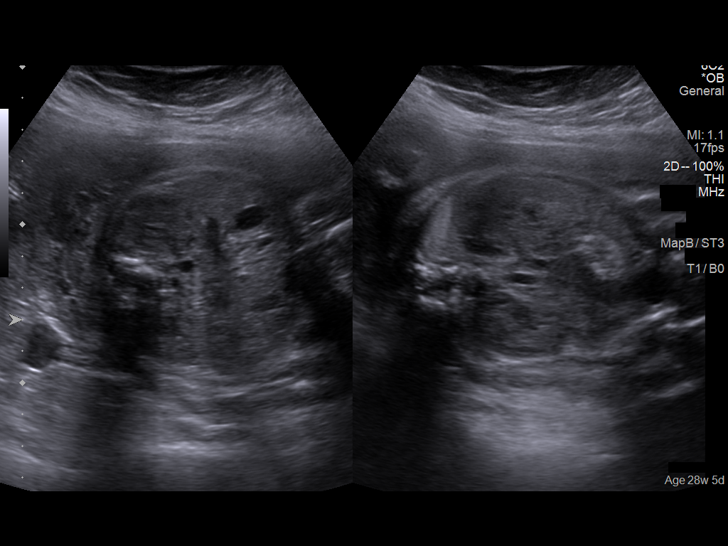
[im 37/53]
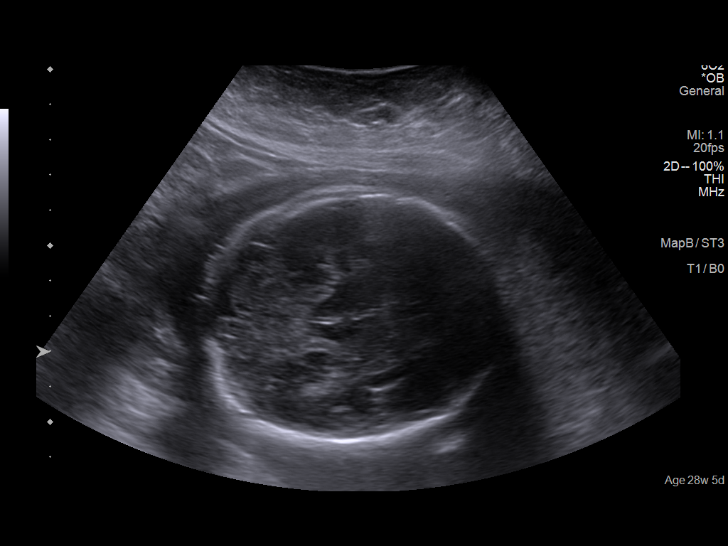
[im 43/53]
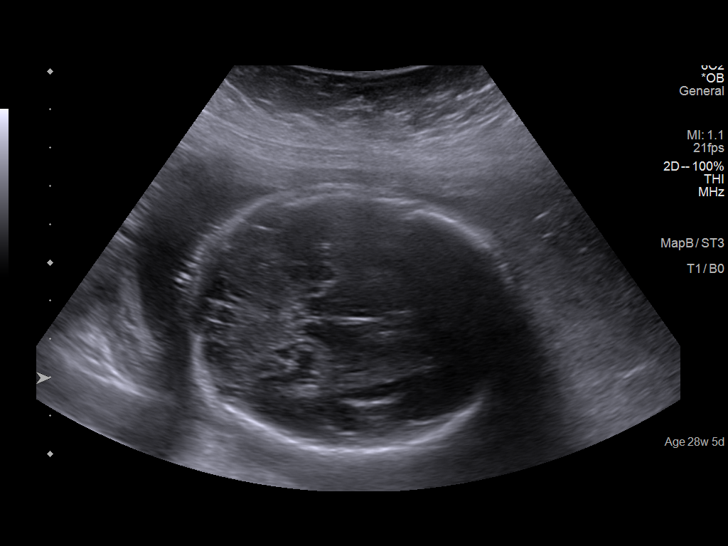
[im 47/53]
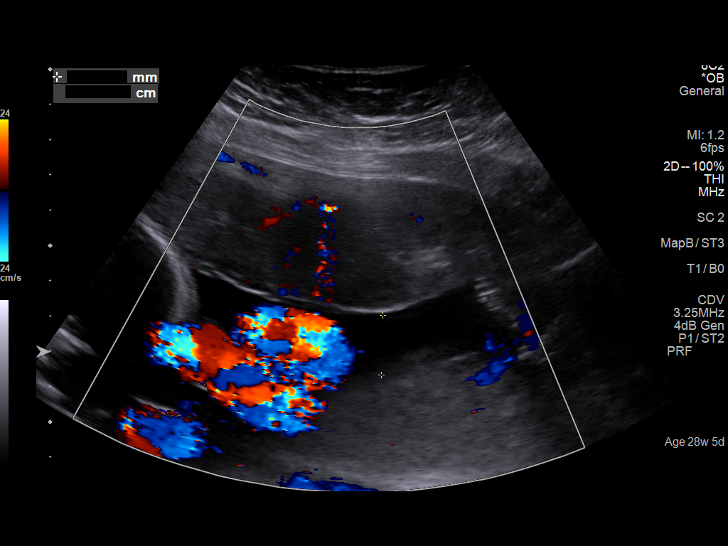
[im 51/53]
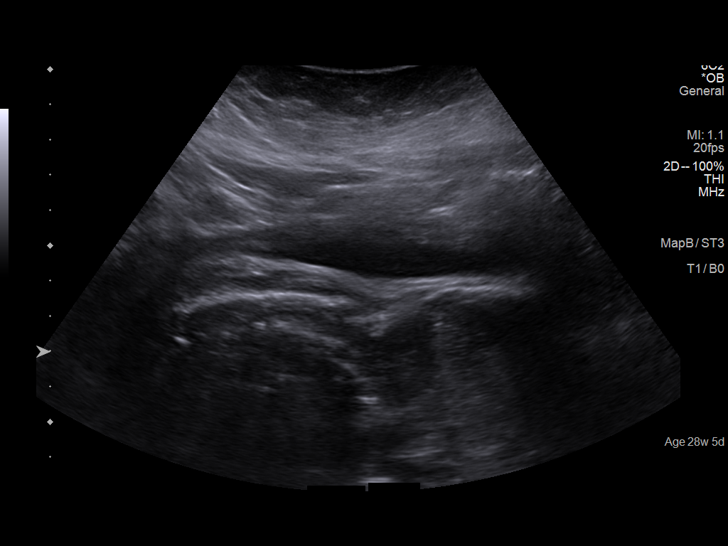

[12 of 28 positions shown; findings below may reference images not displayed]

IMPRESSION: Intrauterine pregnancy with a best estimated gestational age
 of 28 weeks 5 days.  Dating is based on LMP consistent with
 earliest available ultrasound performed at [facility], [HOSPITAL], on 04/21/2018; measurements were
 reported as 16 weeks 6 days.

 Follow up ultrasound performed to assess growth.

 Fetal anatomy visualized appears normal.  Other fetal
 anatomy has been previously documented.

 Appropriate interval growth.

 Recommend follow up ultrasound for growth in 4 weeks.
                 [place], Arm[place] Nora

## 2021-02-17 ENCOUNTER — Telehealth: Payer: Self-pay | Admitting: Internal Medicine

## 2021-02-17 NOTE — Telephone Encounter (Signed)
.. °  Medicaid Managed Care   Unsuccessful Outreach Note  02/17/2021 Name: Dominique Baker MRN: [id number] DOB: Jun 07, 1985  Referred by: Perrin Maltese, MD Reason for referral : High Risk Managed Medicaid (I called the patient today to get her scheduled for a phone visit with the MM Team. She did not answer. I left my name and number on her VM.)   A second unsuccessful telephone outreach was attempted today. The patient was referred to the case management team for assistance with care management and care coordination.   Follow Up Plan: The care management team will reach out to the patient again over the next 7 days.   [place]

## 2021-03-04 ENCOUNTER — Telehealth: Payer: Self-pay | Admitting: Internal Medicine

## 2021-03-04 ENCOUNTER — Other Ambulatory Visit: Payer: Self-pay

## 2021-03-04 ENCOUNTER — Other Ambulatory Visit: Payer: Self-pay | Admitting: *Deleted

## 2021-03-04 NOTE — Patient Instructions (Signed)
Visit Information  Ms. Hensler was given information about Medicaid Managed Care team care coordination services as a part of their [place] Complete Medicaid benefit. Debi Cousin verbally consented to engagement with the [facility] Managed Care team.   If you are experiencing a medical emergency, please call 911 or report to your local emergency department or urgent care.   If you have a non-emergency medical problem during routine business hours, please contact your provider's office and ask to speak with a nurse.   For questions related to your [place] Medicaid health plan, please call: [phone]  If you would like to schedule transportation through your [place] Complete Medicaid plan, please call the following number at least 2 days in advance of your appointment: [phone].   Call the [place] at [phone], at any time, 24 hours a day, 7 days a week. If you are in danger or need immediate medical attention call 911.  If you would like help to quit smoking, call 1-800-QUIT-NOW (949)522-0566) OR Espaol: 1-855-Djelo-Ya (4-818-563-1497) o para ms informacin haga clic aqu or Text READY to 200-400 to register via text  Ms. Mims - following are the goals we discussed in your visit today:   Please see education materials related to HTN, anxiety and depression provided as print materials.   The patient verbalized understanding of instructions provided today and agreed to receive a mailed copy of patient instruction and/or educational materials.  Telephone follow up appointment with Managed Medicaid care management team member scheduled for:03/18/21 @ 2:30pm  Lurena Joiner RN, [place] RN Care Coordinator [phone]  Following is a copy of your plan of care:  Care Plan : RN Care Manager Plan of Care  Updates made by Melissa Montane, RN since 03/04/2021 12:00 AM     Problem: Health Management needs  related to [facility], Depression and Anxiety      Long-Range Goal: Development of Plan of Care to address Health Management needs related to [facility], Depression and Anxiety   Start Date: 03/04/2021  Expected End Date: 06/02/2021  Priority: High  Note:   Current Barriers:  Chronic Disease Management support and education needs related to HTN and Anxiety and Depression  RNCM Clinical Goal(s):  Patient will verbalize understanding of plan for management of HTN and Anxiety and Depression as evidenced by patient verbalization of self monitoring activities demonstrate improved health management independence as evidenced by making and attending an appointment with PCP        work with Education officer, museum to address [place] Concerns  related to the management of Anxiety and Depression as evidenced by review of EMR and patient or social worker report     through collaboration with Consulting civil engineer, provider, and care team.   Interventions: Inter-disciplinary care team collaboration (see longitudinal plan of care) Evaluation of current treatment plan related to  self management and patient's adherence to plan as established by provider Referral to LCSW for assistance with Mental Health needs and managing Anxiety and Depression   Hypertension: (Status: New goal.) Long Term Goal  Last practice recorded BP readings:  BP Readings from Last 3 Encounters:  11/19/20 131/83  09/12/18 (!) 154/93  09/08/18 (!) 144/73  Most recent eGFR/CrCl: No results found for: EGFR  No components found for: CRCL  Evaluation of current treatment plan related to hypertension self management and patient's adherence to plan as established by provider;   Provided education to patient re: stroke prevention, s/s of  heart attack and stroke; Reviewed medications with patient and discussed importance of compliance;  Discussed plans with patient for ongoing care management follow up and provided patient with direct contact information for  care management team; Advised patient, providing education and rationale, to monitor blood pressure daily and record, calling PCP for findings outside established parameters;  Reviewed scheduled/upcoming provider appointments including:  Provided education on prescribed diet heart healthy;  Discussed complications of poorly controlled blood pressure such as heart disease, stroke, circulatory complications, vision complications, kidney impairment, sexual dysfunction;  Assessed social determinant of health barriers;    Patient Goals/Self-Care Activities: Take medications as prescribed   Attend all scheduled provider appointments Call pharmacy for medication refills 3-7 days in advance of running out of medications Perform all self care activities independently  Perform IADL's (shopping, preparing meals, housekeeping, managing finances) independently Call provider office for new concerns or questions  Work with the social worker to address care coordination needs and will continue to work with the clinical team to address health care and disease management related needs call the Suicide and Crisis Lifeline: 988 call 1-800-273-TALK (toll free, 24 hour hotline) go to [facility] [address] [place] [phone]) call 911 if experiencing a Mental Health or [place]  check blood pressure 3 times per week choose a place to take my blood pressure (home, clinic or office, retail store) write blood pressure results in a log or diary take blood pressure log to all doctor appointments keep all doctor appointments take medications for blood pressure exactly as prescribed eat more whole grains, fruits and vegetables, lean meats and healthy fats

## 2021-03-04 NOTE — Patient Outreach (Signed)
Medicaid Managed Care   Nurse Care Manager Note  03/04/2021 Name:  Dominique Baker MRN:  [id number] DOB:  09-29-1985  Dominique Baker is an 36 y.o. year old female who is a primary patient of Perrin Maltese, MD.  The [facility] Managed Care Coordination team was consulted for assistance with:    HTN Anxiety Depression  Dominique Baker was given information about Medicaid Managed Care Coordination team services today. Dominique Baker Patient agreed to services and verbal consent obtained.  Engaged with patient by telephone for initial visit in response to provider referral for case management and/or care coordination services.   Assessments/Interventions:  Review of past medical history, allergies, medications, health status, including review of consultants reports, laboratory and other test data, was performed as part of comprehensive evaluation and provision of chronic care management services.  SDOH (Social Determinants of Health) assessments and interventions performed: SDOH Interventions    Flowsheet Row Most Recent Value  SDOH Interventions   Food Insecurity Interventions Intervention Not Indicated  Housing Interventions Intervention Not Indicated  Transportation Interventions Other (Comment)  [Provided with information regarding transportation provided by [employer]  No Known Allergies  Medications Reviewed Today     Reviewed by Melissa Montane, RN (Registered Nurse) on 03/04/21 at 1337  Med List Status: <None>   Medication Order Taking? Sig Documenting Provider Last Dose Status Informant  beclomethasone (QVAR REDIHALER) 40 MCG/ACT inhaler [id number] No Inhale 2 puffs into the lungs 2 (two) times daily.  Patient not taking: Reported on 11/19/2020   Gatha Mayer, MD Not Taking Active Pharmacy Records  HYDROcodone-acetaminophen [facility]) 5-325 MG tablet [id number] No Take 1 tablet by mouth every 6 (six) hours as needed for moderate pain.   Patient not taking: Reported on 11/19/2020   Schermerhorn, Gwen Her, MD Not Taking Active Pharmacy Records  ibuprofen (ADVIL) 600 MG tablet [id number] No Take 1 tablet (600 mg total) by mouth every 6 (six) hours.  Patient not taking: Reported on 11/19/2020   Schermerhorn, Gwen Her, MD Not Taking Active Pharmacy Records  labetalol (NORMODYNE) 100 MG tablet [id number] No Take 1 tablet (100 mg total) by mouth 2 (two) times daily.  Patient not taking: Reported on 03/04/2021   Nena Polio, MD Not Taking Active   labetalol (NORMODYNE) 300 MG tablet [id number] Yes Take 1 tablet (300 mg total) by mouth 3 (three) times daily. Nena Polio, MD Taking Active            Med Note (Kylian Loh A   Tue Mar 04, 2021  1:34 PM) Taking once a day            Patient Active Problem List   Diagnosis Date Noted   Encounter for planned induction of labor 09/10/2018   Elevated blood pressure affecting pregnancy in third trimester, antepartum 09/08/2018   Chronic hypertension in pregnancy 06/16/2018   Asthma with acute exacerbation? 06/16/2018   Sickle cell trait in mother affecting pregnancy ([place]) 06/16/2018   Hx of premature delivery 06/16/2018   Pre-eclampsia, delivered 01/23/2017   h/o substance use  10/09/2016    Conditions to be addressed/monitored per PCP order:  HTN, Anxiety, and Depression  Care Plan : RN Care Manager Plan of Care  Updates made by Melissa Montane, RN since 03/04/2021 12:00 AM     Problem: Health Management needs related to [facility], Depression and Anxiety      Long-Range Goal: Development of Plan of Care to address Health  Management needs related to [facility], Depression and Anxiety   Start Date: 03/04/2021  Expected End Date: 06/02/2021  Priority: High  Note:   Current Barriers:  Chronic Disease Management support and education needs related to HTN and Anxiety and Depression  RNCM Clinical Goal(s):  Patient will verbalize understanding of plan for management of HTN and  Anxiety and Depression as evidenced by patient verbalization of self monitoring activities demonstrate improved health management independence as evidenced by making and attending an appointment with PCP        work with Education officer, museum to address [place] Concerns  related to the management of Anxiety and Depression as evidenced by review of EMR and patient or social worker report     through collaboration with Consulting civil engineer, provider, and care team.   Interventions: Inter-disciplinary care team collaboration (see longitudinal plan of care) Evaluation of current treatment plan related to  self management and patient's adherence to plan as established by provider Referral to LCSW for assistance with Mental Health needs and managing Anxiety and Depression   Hypertension: (Status: New goal.) Long Term Goal  Last practice recorded BP readings:  BP Readings from Last 3 Encounters:  11/19/20 131/83  09/12/18 (!) 154/93  09/08/18 (!) 144/73  Most recent eGFR/CrCl: No results found for: EGFR  No components found for: CRCL  Evaluation of current treatment plan related to hypertension self management and patient's adherence to plan as established by provider;   Provided education to patient re: stroke prevention, s/s of heart attack and stroke; Reviewed medications with patient and discussed importance of compliance;  Discussed plans with patient for ongoing care management follow up and provided patient with direct contact information for care management team; Advised patient, providing education and rationale, to monitor blood pressure daily and record, calling PCP for findings outside established parameters;  Reviewed scheduled/upcoming provider appointments including:  Provided education on prescribed diet heart healthy;  Discussed complications of poorly controlled blood pressure such as heart disease, stroke, circulatory complications, vision complications, kidney impairment, sexual  dysfunction;  Assessed social determinant of health barriers;    Patient Goals/Self-Care Activities: Take medications as prescribed   Attend all scheduled provider appointments Call pharmacy for medication refills 3-7 days in advance of running out of medications Perform all self care activities independently  Perform IADL's (shopping, preparing meals, housekeeping, managing finances) independently Call provider office for new concerns or questions  Work with the social worker to address care coordination needs and will continue to work with the clinical team to address health care and disease management related needs call the Suicide and Crisis Lifeline: 988 call 1-800-273-TALK (toll free, 24 hour hotline) go to [facility] [address], [place] [phone]) call 911 if experiencing a Mental Health or [place]  check blood pressure 3 times per week choose a place to take my blood pressure (home, clinic or office, retail store) write blood pressure results in a log or diary take blood pressure log to all doctor appointments keep all doctor appointments take medications for blood pressure exactly as prescribed eat more whole grains, fruits and vegetables, lean meats and healthy fats       Follow Up:  Patient agrees to Care Plan and Follow-up.  Plan: The Managed Medicaid care management team will reach out to the patient again over the next 14 days.  Date/time of next scheduled RN care management/care coordination outreach:  03/18/21 @ 2:30pm  Lurena Joiner RN, BSN [facility]   [employer] manager

## 2021-03-04 NOTE — Telephone Encounter (Signed)
.. °  Medicaid Managed Care   Unsuccessful Outreach Note  03/04/2021 Name: Dominique Baker MRN: [id number] DOB: 1985/02/23  Referred by: Margaretann Loveless, MD Reason for referral : High Risk Managed Medicaid (I called the patient today to get her scheduled for a phone visit with the MM LCSW. I left my name and number on her VM.)   An unsuccessful telephone outreach was attempted today. The patient was referred to the case management team for assistance with care management and care coordination.   Follow Up Plan: The care management team will reach out to the patient again over the next 7 days.    Weston Settle Care Guide, High Risk Medicaid Managed Care Embedded Care Coordination [facility]   [facility]

## 2021-03-06 ENCOUNTER — Telehealth: Payer: Self-pay | Admitting: Internal Medicine

## 2021-03-06 NOTE — Telephone Encounter (Signed)
.. °  Medicaid Managed Care   Unsuccessful Outreach Note  03/06/2021 Name: Dominique Baker MRN: [id number] DOB: 30-Jan-1986  Referred by: Margaretann Loveless, MD Reason for referral : High Risk Managed Medicaid (I called the patient today to get her scheduled with the MM LCSW. I left my name and number on her Vm.)   A second unsuccessful telephone outreach was attempted today. The patient was referred to the case management team for assistance with care management and care coordination.   Follow Up Plan: The care management team will reach out to the patient again over the next 7 days.   Weston Settle Care Guide, High Risk Medicaid Managed Care Embedded Care Coordination [facility]   [facility]

## 2021-03-14 ENCOUNTER — Telehealth: Payer: Self-pay | Admitting: *Deleted

## 2021-03-14 NOTE — Chronic Care Management (AMB) (Signed)
° ° °  03/14/2021 Name: Dominique Baker MRN: [id number] DOB: Sep 15, 1985  Referred by: Perrin Maltese, MD Reason for referral : High Risk Managed Medicaid (Outreach to schedule initial call with Licensed Clinical SW )   An unsuccessful telephone outreach was attempted today. The patient was referred to the case management team for assistance with care management and care coordination.    Follow Up Plan: A HIPAA compliant phone message was left for the patient providing contact information and requesting a return call. and The Managed Medicaid care management team will reach out to the patient again over the next 7 days.    [place] Management  Direct Dial: [phone]

## 2021-03-18 ENCOUNTER — Other Ambulatory Visit: Payer: Self-pay | Admitting: *Deleted

## 2021-03-18 ENCOUNTER — Other Ambulatory Visit: Payer: Self-pay

## 2021-03-18 NOTE — Patient Outreach (Signed)
Medicaid Managed Dominique   Nurse Dominique Manager Note  03/18/2021 Name:  Dominique Baker MRN:  [id number] DOB:  1985/12/29  Dominique Baker is an 36 y.o. year old female who is a primary patient of Dominique Maltese, MD.  The [facility] Managed Dominique Coordination team was consulted for assistance with:    HTN Anxiety Depression  Dominique Baker was given information about Medicaid Managed Dominique Coordination team services today. Dominique Baker Patient agreed to services and verbal consent obtained.  Engaged with patient by telephone for follow up visit in response to provider referral for case management and/or Dominique coordination services.   Assessments/Interventions:  Review of past medical history, allergies, medications, health status, including review of consultants reports, laboratory and other test data, was performed as part of comprehensive evaluation and provision of chronic Dominique management services.  SDOH (Social Determinants of Health) assessments and interventions performed: SDOH Interventions    Flowsheet Row Most Recent Value  SDOH Interventions   Stress Interventions Other (Comment)  [Referral to Dominique Baker, number provided to patient for scheduling]       Dominique Plan  No Known Allergies  Medications Reviewed Today     Reviewed by Dominique Montane, RN (Registered Nurse) on 03/18/21 at 74  Med List Status: <None>   Medication Order Taking? Sig Documenting Provider Last Dose Status Informant  beclomethasone (QVAR REDIHALER) 40 MCG/ACT inhaler [id number] No Inhale 2 puffs into the lungs 2 (two) times daily.  Patient not taking: Reported on 11/19/2020   Dominique Mayer, MD Not Taking Active Pharmacy Records  HYDROcodone-acetaminophen [facility]) 5-325 MG tablet [id number] No Take 1 tablet by mouth every 6 (six) hours as needed for moderate pain.  Patient not taking: Reported on 11/19/2020   Baker, Dominique Her, MD Not Taking Active Pharmacy Records  ibuprofen (ADVIL) 600 MG tablet  [id number] No Take 1 tablet (600 mg total) by mouth every 6 (six) hours.  Patient not taking: Reported on 11/19/2020   Baker, Dominique Her, MD Not Taking Active Pharmacy Records  labetalol (NORMODYNE) 100 MG tablet [id number] No Take 1 tablet (100 mg total) by mouth 2 (two) times daily.  Patient not taking: Reported on 03/04/2021   Dominique Polio, MD Not Taking Active   labetalol (NORMODYNE) 300 MG tablet [id number] Yes Take 1 tablet (300 mg total) by mouth 3 (three) times daily. Dominique Polio, MD Taking Active            Med Note Dominique Baker, Dominique Mcclay A   Tue Mar 18, 2021  2:25 PM) Taking twice daily            Patient Active Problem List   Diagnosis Date Noted   Encounter for planned induction of labor 09/10/2018   Elevated blood pressure affecting pregnancy in third trimester, antepartum 09/08/2018   Chronic hypertension in pregnancy 06/16/2018   Asthma with acute exacerbation? 06/16/2018   Sickle cell trait in mother affecting pregnancy (Dominique Baker) 06/16/2018   Hx of premature delivery 06/16/2018   Pre-eclampsia, delivered 01/23/2017   h/o substance use  10/09/2016    Conditions to be addressed/monitored per PCP order:  HTN, Anxiety, and Depression  Dominique Baker  Updates made by Dominique Montane, RN since 03/18/2021 12:00 AM     Problem: Health Management needs related to Dominique Baker, Depression and Anxiety      Long-Range Goal: Development of Plan of Dominique to address Health Management needs related to Dominique Baker, Depression and Anxiety   Start Date: 03/04/2021  Expected End Date: 06/02/2021  Priority: High  Note:   Current Barriers:  Chronic Disease Management support and education needs related to HTN and Anxiety and Depression Dominique Baker has not had the opportunity to schedule a visit with her PCP. She works and attends classes on her days off. She has 4 children that she provides Dominique for. She usually checks her BP while at work, her last checked BP was 269/148.  This was about a month ago, she has not rechecked. She does have a home BP cuff that needs batteries. She is interested in establishing Dominique with a mental health provider and missed calls from Dominique Baker to schedule with Dominique Baker.  RNCM Clinical Goal(s):  Patient will verbalize understanding of plan for management of HTN and Anxiety and Depression as evidenced by patient verbalization of self monitoring activities demonstrate improved health management independence as evidenced by making and attending an appointment with PCP        work with Education officer, museum to address [place] Concerns  related to the management of Anxiety and Depression as evidenced by review of EMR and patient or social worker report     through collaboration with Consulting civil engineer, provider, and Dominique team.   Interventions: Inter-disciplinary Dominique team collaboration (see longitudinal plan of Dominique) Evaluation of current treatment plan related to  self management and patient's adherence to plan as established by provider Provided patient with phone number to Dominique Baker to call and schedule telephone visit with Dominique Dominique Baker Provided patient with encouragement to improve her health   Hypertension: (Status: Goal on track: NO.) Long Term Goal  Last practice recorded BP readings:  BP Readings from Last 3 Encounters:  11/19/20 131/83  09/12/18 (!) 154/93  09/08/18 (!) 144/73  Most recent eGFR/CrCl: No results found for: EGFR  No components found for: CRCL  Provided education to patient re: stroke prevention, s/s of heart attack and stroke; Reviewed medications with patient and discussed importance of compliance;  Counseled on adverse effects of illicit drug and excessive alcohol use in patients with high blood pressure;  Counseled on the importance of exercise goals with target of 150 minutes per week Discussed plans with patient for ongoing Dominique management follow up and provided patient with direct contact information for Dominique  management team; Advised patient, providing education and rationale, to monitor blood pressure daily and record, calling PCP for findings outside established parameters;  Reviewed scheduled/upcoming provider appointments including:  Provided education on prescribed diet heart healthy;  Discussed complications of poorly controlled blood pressure such as heart disease, stroke, circulatory complications, vision complications, kidney impairment, sexual dysfunction;  Assessed social determinant of health barriers;  Advised patient to schedule a visit with her PCP to discuss plan of Dominique for managing her blood pressure Advised patient to check her BP while at work until she can get batteries for her home BP machine and reports elevated readings to her provider   Patient Goals/Self-Dominique Activities: Take medications as prescribed   Attend all scheduled provider appointments Call pharmacy for medication refills 3-7 days in advance of running out of medications Perform all self Dominique activities independently  Perform IADL's (shopping, preparing meals, housekeeping, managing finances) independently Call provider office for new concerns or questions  Work with the social worker to address Dominique coordination needs and will continue to work with the clinical team to address health Dominique and disease management related needs call the Suicide and Crisis Lifeline: 988 call 1-800-273-TALK (toll free, 24 hour  hotline) go to [employer] [facility] [address], [place] [phone]) call 911 if experiencing a Mental Health or [place]  check blood pressure 3 times per week choose a place to take my blood pressure (home, clinic or office, retail store) write blood pressure results in a log or diary take blood pressure log to all doctor appointments keep all doctor appointments take medications for blood pressure exactly as prescribed eat more whole grains, fruits and  vegetables, lean meats and healthy fats       Follow Up:  Patient agrees to Dominique Plan and Follow-up.  Plan: The Managed Medicaid Dominique management team will reach out to the patient again over the next 14 days.  Date/time of next scheduled RN Dominique management/Dominique coordination outreach:  04/01/21 @ 1:15pm  Lurena Joiner RN, BSN [place] RN Dominique Coordinator

## 2021-03-18 NOTE — Patient Instructions (Signed)
Visit Information  Ms. Maggard was given information about Medicaid Managed Care team care coordination services as a part of their [place] Complete Medicaid benefit. Margean Korell verbally consented to engagement with the [facility] Managed Care team.   If you are experiencing a medical emergency, please call 911 or report to your local emergency department or urgent care.   If you have a non-emergency medical problem during routine business hours, please contact your provider's office and ask to speak with a nurse.   For questions related to your [place] Complete Medicaid health plan, please call: [phone]  If you would like to schedule transportation through your [place] Complete Medicaid plan, please call the following number at least 2 days in advance of your appointment: [phone].   Call the [place] at [phone], at any time, 24 hours a day, 7 days a week. If you are in danger or need immediate medical attention call 911.  If you would like help to quit smoking, call 1-800-QUIT-NOW 204-130-9475) OR Espaol: 1-855-Djelo-Ya (0-737-106-2694) o para ms informacin haga clic aqu or Text READY to 200-400 to register via text  Ms. Alcivar - following are the goals we discussed in your visit today:   Please see education materials related to managing HTN and stress provided by MyChart link.  Patient verbalizes understanding of instructions and care plan provided today and agrees to view in [place]. Active MyChart status confirmed with patient.    Telephone follow up appointment with Managed Medicaid care management team member scheduled for:04/01/21 @ 1:15pm  Lurena Joiner RN, BSN [place] RN Care Coordinator   Following is a copy of your plan of care:  Care Plan : RN Care Manager Plan of Care  Updates made by Melissa Montane, RN since 03/18/2021 12:00 AM     Problem: Health Management needs related to [facility],  Depression and Anxiety      Long-Range Goal: Development of Plan of Care to address Health Management needs related to [facility], Depression and Anxiety   Start Date: 03/04/2021  Expected End Date: 06/02/2021  Priority: High  Note:   Current Barriers:  Chronic Disease Management support and education needs related to HTN and Anxiety and Depression Ms. Bump has not had the opportunity to schedule a visit with her PCP. She works and attends classes on her days off. She has 4 children that she provides care for. She usually checks her BP while at work, her last checked BP was 269/148. This was about a month ago, she has not rechecked. She does have a home BP cuff that needs batteries. She is interested in establishing care with a mental health provider and missed calls from [place] to schedule with LCSW.  RNCM Clinical Goal(s):  Patient will verbalize understanding of plan for management of HTN and Anxiety and Depression as evidenced by patient verbalization of self monitoring activities demonstrate improved health management independence as evidenced by making and attending an appointment with PCP        work with Education officer, museum to address [place] Concerns  related to the management of Anxiety and Depression as evidenced by review of EMR and patient or social worker report     through collaboration with Consulting civil engineer, provider, and care team.   Interventions: Inter-disciplinary care team collaboration (see longitudinal plan of care) Evaluation of current treatment plan related to  self management and patient's adherence to plan as established by provider Provided patient with  phone number to MM Care Guide to call and schedule telephone visit with MM LCSW Provided patient with encouragement to improve her health   Hypertension: (Status: Goal on track: NO.) Long Term Goal  Last practice recorded BP readings:  BP Readings from Last 3 Encounters:  11/19/20 131/83  09/12/18 (!) 154/93   09/08/18 (!) 144/73  Most recent eGFR/CrCl: No results found for: EGFR  No components found for: CRCL  Provided education to patient re: stroke prevention, s/s of heart attack and stroke; Reviewed medications with patient and discussed importance of compliance;  Counseled on adverse effects of illicit drug and excessive alcohol use in patients with high blood pressure;  Counseled on the importance of exercise goals with target of 150 minutes per week Discussed plans with patient for ongoing care management follow up and provided patient with direct contact information for care management team; Advised patient, providing education and rationale, to monitor blood pressure daily and record, calling PCP for findings outside established parameters;  Reviewed scheduled/upcoming provider appointments including:  Provided education on prescribed diet heart healthy;  Discussed complications of poorly controlled blood pressure such as heart disease, stroke, circulatory complications, vision complications, kidney impairment, sexual dysfunction;  Assessed social determinant of health barriers;  Advised patient to schedule a visit with her PCP to discuss plan of care for managing her blood pressure Advised patient to check her BP while at work until she can get batteries for her home BP machine and reports elevated readings to her provider   Patient Goals/Self-Care Activities: Take medications as prescribed   Attend all scheduled provider appointments Call pharmacy for medication refills 3-7 days in advance of running out of medications Perform all self care activities independently  Perform IADL's (shopping, preparing meals, housekeeping, managing finances) independently Call provider office for new concerns or questions  Work with the social worker to address care coordination needs and will continue to work with the clinical team to address health care and disease management related needs call the  Suicide and Crisis Lifeline: 988 call 1-800-273-TALK (toll free, 24 hour hotline) go to [facility] [address], [place] [phone]) call 911 if experiencing a Mental Health or [place]  check blood pressure 3 times per week choose a place to take my blood pressure (home, clinic or office, retail store) write blood pressure results in a log or diary take blood pressure log to all doctor appointments keep all doctor appointments take medications for blood pressure exactly as prescribed eat more whole grains, fruits and vegetables, lean meats and healthy fats

## 2021-03-26 ENCOUNTER — Telehealth: Payer: Self-pay | Admitting: *Deleted

## 2021-03-26 ENCOUNTER — Ambulatory Visit: Payer: Medicaid Other | Admitting: *Deleted

## 2021-03-26 NOTE — Patient Outreach (Signed)
°  Care Management   Follow Up Note   03/26/2021  Name: Dominique Baker MRN: [id number] DOB: 08/31/1985  Referred by: Perrin Maltese, MD  Reason for Referral:  Chronic Care Management Needs in Patient with Depression and History of Substance Abuse.  An unsuccessful telephone outreach was attempted today. The patient was referred to the case management team for assistance with care management and care coordination. A HIPAA compliant message was left on voicemail, including contact information, encouraging patient to return LCSW's call at her earliest convenience.  LCSW will make a second initial telephone outreach call within the next 5-7 business days, if a return call is not received from patient in the meantime.  Follow-Up Plan:  Scheduling Care Guide has scheduled an initial telephone outreach call for patient with LCSW on 04/01/2021 at 3:30 pm.  Nat Christen, BSW, MSW, [place]  Licensed Clinical Social Worker  [place]  Mailing [place]. [address] [place], [place] [address] Physical Address-[address] [place], [place] [address] Toll Free Main # [phone] Fax # [phone] Cell # [phone] [name]

## 2021-04-01 ENCOUNTER — Other Ambulatory Visit: Payer: Self-pay | Admitting: *Deleted

## 2021-04-01 ENCOUNTER — Telehealth: Payer: Self-pay | Admitting: *Deleted

## 2021-04-01 NOTE — Patient Outreach (Signed)
°  Care Management   Follow Up Note   04/01/2021  Name: Dominique Baker MRN: [id number] DOB: [birth year]  Referred By: Margaretann Loveless, MD  Reason for Referral:  Chronic Care Management Needs in Patient with History of Substance Abuse, Asthma with Acute Exacerbation, and Depression.  An unsuccessful telephone outreach was attempted today. The patient was referred to the case management team for assistance with care management and care coordination. HIPAA compliant messages were left on voicemail, including contact information, encouraging patient to return LCSW's call at her earliest convenience.  LCSW will make a second initial telephone outreach call attempt within the next 5-7 business days, if a return call is not received from patient in the meantime.  Follow-Up Plan:  Request placed with Scheduling Care Guide to reschedule patient's initial telephone outreach call with LCSW.  Danford Bad, BSW, MSW, LCSW  Licensed Restaurant manager, fast food [facility]  Mailing [place] [address], [place], [place] [address] Physical Address-[address], [place], [place] [address] Toll Free Main # [phone] Fax # [phone] Cell # [phone] [email or web address]

## 2021-04-01 NOTE — Patient Instructions (Signed)
Visit Information  [name]  - as a part of your Medicaid benefit, you are eligible for care management and care coordination services at no cost or copay. I was unable to reach you by phone today but would be happy to help you with your health related needs. Please feel free to call me @ [phone].   A member of the Managed Medicaid care management team will reach out to you again over the next 14 days.   Estanislado Emms RN, BSN [place]   [facility] Economist

## 2021-04-01 NOTE — Patient Outreach (Signed)
Care Coordination  04/01/2021  Samyrah Bruster [birth year] [id number]   Medicaid Managed Care   Unsuccessful Outreach Note  04/01/2021 Name: Penina Reisner MRN: [id number] DOB: March 01, 1985  Referred by: Margaretann Loveless, MD Reason for referral : High Risk Managed Medicaid (Unsuccessful RNCM follow up telephone outreach)   An unsuccessful telephone outreach was attempted today. The patient was referred to the case management team for assistance with care management and care coordination.   Follow Up Plan: A HIPAA compliant phone message was left for the patient providing contact information and requesting a return call.   Estanislado Emms RN, BSN [place]   [facility] Economist

## 2021-04-01 NOTE — Patient Outreach (Signed)
[facility]) Care Management  04/01/2021  Brittny Sanroman 1985/09/07 [id number]   Erroneous Encounter.  Nat Christen, BSW, MSW, LCSW  Licensed Education officer, environmental [facility]  Mailing [place] [address] [place], [place] [address] Physical Address[address] [place], [place] [address] Toll Free Main # [phone] Fax # [phone] Cell # [phone] [name]

## 2021-04-09 ENCOUNTER — Telehealth: Payer: Self-pay | Admitting: *Deleted

## 2021-04-09 NOTE — Chronic Care Management (AMB) (Signed)
?  Care Management  ? ?04/09/2021 ?Name: Dominique Baker MRN: [id number] DOB: Apr 18, 1985 ? ?Referred by: Perrin Maltese, MD ?Reason for referral : High Risk Managed Medicaid (Second Outreach to reschedule initial missed call with SW ) ? ? ?A second unsuccessful telephone outreach was attempted today. The patient was referred to the case management team for assistance with care management and care coordination.   ? ?Follow Up Plan: A HIPAA compliant phone message was left for the patient providing contact information and requesting a return call. and The Managed Medicaid care management team will reach out to the patient again over the next 7 days.  ? ?Laverda Sorenson  ?Care Guide, Embedded Care Coordination  High Risk Medicaid Managed Care  ?[place]  ?Direct Dial: [phone]  ?

## 2021-04-14 ENCOUNTER — Other Ambulatory Visit: Payer: Self-pay | Admitting: *Deleted

## 2021-04-14 NOTE — Patient Outreach (Signed)
Care Coordination ? ?04/14/2021 ? ?[name] ?Dec 06, 1985 ?[id number] ? ? ?Medicaid Managed Care  ? ?Unsuccessful Outreach Note ? ?04/14/2021 ?Name: Dominique Baker MRN: [id number] DOB: Dec 17, 1985 ? ?Referred by: Margaretann Loveless, MD ?Reason for referral : High Risk Managed Medicaid (Unsuccessful RNCM outreach) ? ? ?Third unsuccessful telephone outreach was attempted today. The patient was referred to the case management team for assistance with care management and care coordination. The patient's primary care provider has been notified of our unsuccessful attempts to make or maintain contact with the patient. The care management team is pleased to engage with this patient at any time in the future should he/she be interested in assistance from the care management team.  ? ?Follow Up Plan: We have been unable to make contact with the patient for follow up. The care management team is available to follow up with the patient after provider conversation with the patient regarding recommendation for care management engagement and subsequent re-referral to the care management team.  ?A HIPAA compliant phone message was left for the patient providing contact information and requesting a return call.  ? ?Estanislado Emms RN, BSN ?[place]  [facility] ?RN Care Coordinator ? ? ?

## 2021-04-16 ENCOUNTER — Encounter: Payer: Self-pay | Admitting: *Deleted

## 2021-04-16 ENCOUNTER — Ambulatory Visit: Payer: Self-pay | Admitting: *Deleted

## 2021-04-16 ENCOUNTER — Telehealth: Payer: Self-pay | Admitting: *Deleted

## 2021-04-16 NOTE — Patient Outreach (Signed)
?  Care Management  ? ?Follow Up Note ? ? ?04/16/2021 ? ?Name: Dominique Baker MRN: [id number] DOB: 1985-06-17 ? ?Referred By: Margaretann Loveless, MD ? ?Reason for Referral: High Risk Managed Medicaid Patient with History of Substance Abuse, Asthma with Acute Exacerbation, and Depression. ? ?An unsuccessful telephone outreach was attempted today. The patient was referred to the case management team for assistance with care management and care coordination. HIPAA compliant messages left on voicemail, providing contact information for LCSW, encouraging patient to return LCSW's call at her earliest convenience.  Unsuccessful Outreach Letter, as well as Case Closure Letter, mailed to patient's home. ? ?Follow-Up Plan: We have been unable to make contact with the patient for initial outreach. The care management team is available to follow up with the patient after provider conversation with the patient regarding recommendation for care management engagement and subsequent re-referral to the care management team.  ? ?Danford Bad, BSW, MSW, LCSW  ?Licensed Clinical Social Worker  ?[facility] Customer service manager Care Management ?[place] [facility]  ?Mailing Address-[address] [place], [place] [address] ?Physical Address-[address] [place], [place], [place] [address] ?Toll Free Main # [phone] ?Fax # [phone] ?Cell # [phone] ?[email or web address] ? ? ? ? ? ?  ?

## 2021-08-12 DIAGNOSIS — I10 Essential (primary) hypertension: Secondary | ICD-10-CM | POA: Diagnosis not present

## 2021-08-12 DIAGNOSIS — F32A Depression, unspecified: Secondary | ICD-10-CM | POA: Diagnosis not present

## 2021-08-12 DIAGNOSIS — E668 Other obesity: Secondary | ICD-10-CM | POA: Diagnosis not present

## 2021-08-14 DIAGNOSIS — I10 Essential (primary) hypertension: Secondary | ICD-10-CM | POA: Diagnosis not present

## 2021-08-14 DIAGNOSIS — F32A Depression, unspecified: Secondary | ICD-10-CM | POA: Diagnosis not present

## 2021-09-04 DIAGNOSIS — F411 Generalized anxiety disorder: Secondary | ICD-10-CM | POA: Diagnosis not present

## 2021-09-04 DIAGNOSIS — F32A Depression, unspecified: Secondary | ICD-10-CM | POA: Diagnosis not present

## 2021-09-04 DIAGNOSIS — I10 Essential (primary) hypertension: Secondary | ICD-10-CM | POA: Diagnosis not present

## 2021-11-11 DIAGNOSIS — Z20822 Contact with and (suspected) exposure to covid-19: Secondary | ICD-10-CM | POA: Diagnosis not present

## 2022-05-24 ENCOUNTER — Emergency Department (EMERGENCY_DEPARTMENT_HOSPITAL)
Admission: EM | Admit: 2022-05-24 | Discharge: 2022-05-25 | Disposition: A | Discharge status: Other Acute Inpatient Hospital | Payer: Medicaid Other | Source: Home / Self Care | Attending: Emergency Medicine | Admitting: Emergency Medicine

## 2022-05-24 ENCOUNTER — Emergency Department: Payer: Medicaid Other

## 2022-05-24 DIAGNOSIS — R45851 Suicidal ideations: Secondary | ICD-10-CM

## 2022-05-24 DIAGNOSIS — S0083XA Contusion of other part of head, initial encounter: Secondary | ICD-10-CM

## 2022-05-24 DIAGNOSIS — F1010 Alcohol abuse, uncomplicated: Secondary | ICD-10-CM | POA: Insufficient documentation

## 2022-05-24 DIAGNOSIS — F10920 Alcohol use, unspecified with intoxication, uncomplicated: Secondary | ICD-10-CM

## 2022-05-24 DIAGNOSIS — M25561 Pain in right knee: Secondary | ICD-10-CM

## 2022-05-24 DIAGNOSIS — F332 Major depressive disorder, recurrent severe without psychotic features: Secondary | ICD-10-CM | POA: Insufficient documentation

## 2022-05-24 DIAGNOSIS — M25512 Pain in left shoulder: Secondary | ICD-10-CM

## 2022-05-24 DIAGNOSIS — T7491XA Unspecified adult maltreatment, confirmed, initial encounter: Secondary | ICD-10-CM | POA: Insufficient documentation

## 2022-05-24 DIAGNOSIS — M19012 Primary osteoarthritis, left shoulder: Secondary | ICD-10-CM | POA: Diagnosis not present

## 2022-05-24 DIAGNOSIS — M25511 Pain in right shoulder: Secondary | ICD-10-CM | POA: Insufficient documentation

## 2022-05-24 DIAGNOSIS — R799 Abnormal finding of blood chemistry, unspecified: Secondary | ICD-10-CM | POA: Insufficient documentation

## 2022-05-24 DIAGNOSIS — S50811A Abrasion of right forearm, initial encounter: Secondary | ICD-10-CM | POA: Insufficient documentation

## 2022-05-24 DIAGNOSIS — T7411XA Adult physical abuse, confirmed, initial encounter: Secondary | ICD-10-CM | POA: Insufficient documentation

## 2022-05-24 DIAGNOSIS — Y907 Blood alcohol level of 200-239 mg/100 ml: Secondary | ICD-10-CM | POA: Insufficient documentation

## 2022-05-24 DIAGNOSIS — F1020 Alcohol dependence, uncomplicated: Secondary | ICD-10-CM | POA: Insufficient documentation

## 2022-05-24 DIAGNOSIS — R4182 Altered mental status, unspecified: Secondary | ICD-10-CM | POA: Diagnosis not present

## 2022-05-24 DIAGNOSIS — S8991XA Unspecified injury of right lower leg, initial encounter: Secondary | ICD-10-CM | POA: Diagnosis not present

## 2022-05-24 DIAGNOSIS — S199XXA Unspecified injury of neck, initial encounter: Secondary | ICD-10-CM | POA: Diagnosis not present

## 2022-05-24 DIAGNOSIS — M25562 Pain in left knee: Secondary | ICD-10-CM | POA: Diagnosis not present

## 2022-05-24 DIAGNOSIS — Z0471 Encounter for examination and observation following alleged adult physical abuse: Secondary | ICD-10-CM | POA: Diagnosis not present

## 2022-05-24 DIAGNOSIS — I10 Essential (primary) hypertension: Secondary | ICD-10-CM | POA: Diagnosis not present

## 2022-05-24 LAB — COMPREHENSIVE METABOLIC PANEL
ALT: 17 U/L (ref 0–44)
AST: 35 U/L (ref 15–41)
Albumin: 3.9 g/dL (ref 3.5–5.0)
Alkaline Phosphatase: 67 U/L (ref 38–126)
Anion gap: 15 (ref 5–15)
BUN: 13 mg/dL (ref 6–20)
CO2: 17 mmol/L — ABNORMAL LOW (ref 22–32)
Calcium: 8.5 mg/dL — ABNORMAL LOW (ref 8.9–10.3)
Chloride: 109 mmol/L (ref 98–111)
Creatinine, Ser: 0.95 mg/dL (ref 0.44–1.00)
GFR, Estimated: >60 mL/min (ref >60)
Glucose, Bld: 91 mg/dL (ref 70–99)
Potassium: 3.5 mmol/L (ref 3.5–5.1)
Sodium: 141 mmol/L (ref 135–145)
Total Bilirubin: 0.7 mg/dL (ref 0.3–1.2)
Total Protein: 7.5 g/dL (ref 6.5–8.1)

## 2022-05-24 LAB — CBC
HCT: 36 % (ref 36.0–46.0)
Hemoglobin: 11.4 g/dL — ABNORMAL LOW (ref 12.0–15.0)
MCH: 25.2 pg — ABNORMAL LOW (ref 26.0–34.0)
MCHC: 31.7 g/dL (ref 30.0–36.0)
MCV: 79.5 fL — ABNORMAL LOW (ref 80.0–100.0)
Platelets: 255 10*3/uL (ref 150–400)
RBC: 4.53 MIL/uL (ref 3.87–5.11)
RDW: 16.6 % — ABNORMAL HIGH (ref 11.5–15.5)
WBC: 10.2 10*3/uL (ref 4.0–10.5)
nRBC: 0 % (ref 0.0–0.2)

## 2022-05-24 LAB — SALICYLATE LEVEL: Salicylate Lvl: <7 mg/dL — ABNORMAL LOW (ref 7.0–30.0)

## 2022-05-24 LAB — URINE DRUG SCREEN, QUALITATIVE (ARMC ONLY)
Amphetamines, Ur Screen: NOT DETECTED
Barbiturates, Ur Screen: NOT DETECTED
Benzodiazepine, Ur Scrn: NOT DETECTED
Cannabinoid 50 Ng, Ur ~~LOC~~: NOT DETECTED
Cocaine Metabolite,Ur ~~LOC~~: NOT DETECTED
MDMA (Ecstasy)Ur Screen: NOT DETECTED
Methadone Scn, Ur: NOT DETECTED
Opiate, Ur Screen: NOT DETECTED
Phencyclidine (PCP) Ur S: NOT DETECTED

## 2022-05-24 LAB — HCG, QUANTITATIVE, PREGNANCY: hCG, Beta Chain, Quant, S: <1 m[IU]/mL (ref <5)

## 2022-05-24 LAB — ACETAMINOPHEN LEVEL: Acetaminophen (Tylenol), Serum: <10 ug/mL — ABNORMAL LOW (ref 10–30)

## 2022-05-24 LAB — CBG MONITORING, ED: Glucose-Capillary: 87 mg/dL (ref 70–99)

## 2022-05-24 LAB — ETHANOL: Alcohol, Ethyl (B): 227 mg/dL — ABNORMAL HIGH (ref <10)

## 2022-05-24 MED ORDER — KETOROLAC TROMETHAMINE 15 MG/ML IJ SOLN
15.0000 mg | Freq: Once | INTRAMUSCULAR | Status: AC
  Administered 2022-05-24: 15 mg via INTRAVENOUS
  Filled 2022-05-24: qty 1

## 2022-05-24 MED ORDER — SODIUM CHLORIDE 0.9 % IV BOLUS
1000.0000 mL | Freq: Once | INTRAVENOUS | Status: AC
  Administered 2022-05-24: 1000 mL via INTRAVENOUS

## 2022-05-24 MED ORDER — AMOXICILLIN-POT CLAVULANATE 875-125 MG PO TABS: 1.0000 | ORAL_TABLET | Freq: Two times a day (BID) | ORAL | 0 refills | Status: DC

## 2022-05-24 NOTE — ED Notes (Signed)
Pt awake alert and oriented with amnesia to events leading to visit. Patient refusing to talk about events leading to visit. Pt provided cordless phone and called her son.
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
## 2022-05-24 NOTE — ED Notes (Signed)
Patient situating self in bed. Airway intact and no concern for deterioration or distress. CB in reach. Will continue to monitor.
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
## 2022-05-24 NOTE — ED Notes (Signed)
Patient requesting to speak to someone regarding alcohol abuse. MD Quale notified.

## 2022-05-24 NOTE — ED Notes (Signed)
Pt found to have a birthday [birth year], not 09-09-1985 as listed on chart. Registration notified.
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
## 2022-05-25 ENCOUNTER — Other Ambulatory Visit: Payer: Self-pay

## 2022-05-25 ENCOUNTER — Encounter: Payer: Self-pay | Admitting: Psychiatric/Mental Health

## 2022-05-25 ENCOUNTER — Inpatient Hospital Stay
Admission: AD | Admit: 2022-05-25 | Discharge: 2022-05-26 | DRG: 897 | Disposition: A | Discharge status: Home / Self Care | Payer: Medicaid Other | Source: Ambulatory Visit | Attending: Psychiatry | Admitting: Psychiatry

## 2022-05-25 DIAGNOSIS — M25512 Pain in left shoulder: Secondary | ICD-10-CM

## 2022-05-25 DIAGNOSIS — F1010 Alcohol abuse, uncomplicated: Principal | ICD-10-CM | POA: Diagnosis present

## 2022-05-25 DIAGNOSIS — F332 Major depressive disorder, recurrent severe without psychotic features: Secondary | ICD-10-CM

## 2022-05-25 DIAGNOSIS — F32A Depression, unspecified: Secondary | ICD-10-CM | POA: Insufficient documentation

## 2022-05-25 DIAGNOSIS — S0083XA Contusion of other part of head, initial encounter: Secondary | ICD-10-CM

## 2022-05-25 DIAGNOSIS — T7491XA Unspecified adult maltreatment, confirmed, initial encounter: Secondary | ICD-10-CM | POA: Insufficient documentation

## 2022-05-25 DIAGNOSIS — F329 Major depressive disorder, single episode, unspecified: Secondary | ICD-10-CM | POA: Insufficient documentation

## 2022-05-25 DIAGNOSIS — M25561 Pain in right knee: Secondary | ICD-10-CM

## 2022-05-25 DIAGNOSIS — R45851 Suicidal ideations: Secondary | ICD-10-CM

## 2022-05-25 DIAGNOSIS — F1994 Other psychoactive substance use, unspecified with psychoactive substance-induced mood disorder: Secondary | ICD-10-CM | POA: Insufficient documentation

## 2022-05-25 DIAGNOSIS — F10920 Alcohol use, unspecified with intoxication, uncomplicated: Secondary | ICD-10-CM

## 2022-05-25 DIAGNOSIS — F10120 Alcohol abuse with intoxication, uncomplicated: Secondary | ICD-10-CM | POA: Diagnosis not present

## 2022-05-25 DIAGNOSIS — Y907 Blood alcohol level of 200-239 mg/100 ml: Secondary | ICD-10-CM | POA: Diagnosis present

## 2022-05-25 DIAGNOSIS — Z0471 Encounter for examination and observation following alleged adult physical abuse: Secondary | ICD-10-CM | POA: Diagnosis not present

## 2022-05-25 DIAGNOSIS — F064 Anxiety disorder due to known physiological condition: Secondary | ICD-10-CM | POA: Diagnosis not present

## 2022-05-25 DIAGNOSIS — Z91410 Personal history of adult physical and sexual abuse: Secondary | ICD-10-CM

## 2022-05-25 DIAGNOSIS — S8991XA Unspecified injury of right lower leg, initial encounter: Secondary | ICD-10-CM | POA: Diagnosis not present

## 2022-05-25 DIAGNOSIS — I10 Essential (primary) hypertension: Secondary | ICD-10-CM | POA: Diagnosis not present

## 2022-05-25 DIAGNOSIS — Z79899 Other long term (current) drug therapy: Secondary | ICD-10-CM

## 2022-05-25 DIAGNOSIS — M25562 Pain in left knee: Secondary | ICD-10-CM | POA: Diagnosis not present

## 2022-05-25 DIAGNOSIS — Z9151 Personal history of suicidal behavior: Secondary | ICD-10-CM

## 2022-05-25 DIAGNOSIS — M25511 Pain in right shoulder: Secondary | ICD-10-CM | POA: Diagnosis not present

## 2022-05-25 DIAGNOSIS — M19012 Primary osteoarthritis, left shoulder: Secondary | ICD-10-CM | POA: Diagnosis not present

## 2022-05-25 DIAGNOSIS — S199XXA Unspecified injury of neck, initial encounter: Secondary | ICD-10-CM | POA: Diagnosis not present

## 2022-05-25 DIAGNOSIS — R4182 Altered mental status, unspecified: Secondary | ICD-10-CM | POA: Diagnosis not present

## 2022-05-25 MED ORDER — LABETALOL HCL 100 MG PO TABS
100.0000 mg | ORAL_TABLET | Freq: Two times a day (BID) | ORAL | Status: DC
  Administered 2022-05-25 – 2022-05-26 (×2): 100 mg via ORAL
  Filled 2022-05-25 (×2): qty 1

## 2022-05-25 MED ORDER — MAGNESIUM HYDROXIDE 400 MG/5ML PO SUSP: 30.0000 mL | Freq: Every day | ORAL | Status: DC | PRN

## 2022-05-25 MED ORDER — THIAMINE HCL 100 MG/ML IJ SOLN
100.0000 mg | Freq: Every day | INTRAMUSCULAR | Status: DC
  Filled 2022-05-25 (×2): qty 1

## 2022-05-25 MED ORDER — TRAZODONE HCL 50 MG PO TABS: 50.0000 mg | ORAL_TABLET | Freq: Every evening | ORAL | Status: DC | PRN

## 2022-05-25 MED ORDER — LORAZEPAM 2 MG/ML IJ SOLN: 2.0000 mg | Freq: Three times a day (TID) | INTRAMUSCULAR | Status: DC | PRN

## 2022-05-25 MED ORDER — LORAZEPAM 2 MG PO TABS: 2.0000 mg | ORAL_TABLET | Freq: Three times a day (TID) | ORAL | Status: DC | PRN

## 2022-05-25 MED ORDER — AMOXICILLIN-POT CLAVULANATE 875-125 MG PO TABS
1.0000 | ORAL_TABLET | Freq: Two times a day (BID) | ORAL | Status: DC
  Administered 2022-05-25 – 2022-05-26 (×3): 1 via ORAL
  Filled 2022-05-25 (×4): qty 1

## 2022-05-25 MED ORDER — DIPHENHYDRAMINE HCL 25 MG PO CAPS: 50.0000 mg | ORAL_CAPSULE | Freq: Three times a day (TID) | ORAL | Status: DC | PRN

## 2022-05-25 MED ORDER — DIPHENHYDRAMINE HCL 50 MG/ML IJ SOLN: 50.0000 mg | Freq: Three times a day (TID) | INTRAMUSCULAR | Status: DC | PRN

## 2022-05-25 MED ORDER — HALOPERIDOL 5 MG PO TABS: 5.0000 mg | ORAL_TABLET | Freq: Three times a day (TID) | ORAL | Status: DC | PRN

## 2022-05-25 MED ORDER — HYDROXYZINE HCL 25 MG PO TABS
25.0000 mg | ORAL_TABLET | Freq: Three times a day (TID) | ORAL | Status: DC | PRN
  Administered 2022-05-26: 25 mg via ORAL
  Filled 2022-05-25: qty 1

## 2022-05-25 MED ORDER — ALUM & MAG HYDROXIDE-SIMETH 200-200-20 MG/5ML PO SUSP: 30.0000 mL | ORAL | Status: DC | PRN

## 2022-05-25 MED ORDER — FOLIC ACID 1 MG PO TABS
1.0000 mg | ORAL_TABLET | Freq: Every day | ORAL | Status: DC
  Administered 2022-05-25 – 2022-05-26 (×2): 1 mg via ORAL
  Filled 2022-05-25 (×2): qty 1

## 2022-05-25 MED ORDER — ACETAMINOPHEN 325 MG PO TABS: 650.0000 mg | ORAL_TABLET | Freq: Four times a day (QID) | ORAL | Status: DC | PRN

## 2022-05-25 MED ORDER — HALOPERIDOL LACTATE 5 MG/ML IJ SOLN: 5.0000 mg | Freq: Three times a day (TID) | INTRAMUSCULAR | Status: DC | PRN

## 2022-05-25 MED ORDER — FLUOXETINE HCL 20 MG PO CAPS
20.0000 mg | ORAL_CAPSULE | Freq: Every day | ORAL | Status: DC
  Administered 2022-05-25 – 2022-05-26 (×2): 20 mg via ORAL
  Filled 2022-05-25 (×2): qty 1

## 2022-05-25 MED ORDER — LORAZEPAM 1 MG PO TABS
1.0000 mg | ORAL_TABLET | ORAL | Status: DC | PRN
  Administered 2022-05-25 – 2022-05-26 (×2): 1 mg via ORAL
  Filled 2022-05-25 (×2): qty 1

## 2022-05-25 MED ORDER — THIAMINE MONONITRATE 100 MG PO TABS
100.0000 mg | ORAL_TABLET | Freq: Every day | ORAL | Status: DC
  Administered 2022-05-25 – 2022-05-26 (×2): 100 mg via ORAL
  Filled 2022-05-25 (×2): qty 1

## 2022-05-25 MED ORDER — ADULT MULTIVITAMIN W/MINERALS CH
1.0000 | ORAL_TABLET | Freq: Every day | ORAL | Status: DC
  Administered 2022-05-25 – 2022-05-26 (×2): 1 via ORAL
  Filled 2022-05-25 (×2): qty 1

## 2022-05-25 MED ORDER — AMOXICILLIN-POT CLAVULANATE 875-125 MG PO TABS: 1.0000 | ORAL_TABLET | Freq: Two times a day (BID) | ORAL | Status: DC
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
## 2022-05-25 NOTE — Group Note (Signed)
Date:  05/25/2022 Time:  4:29 PM  Group Topic/Focus:  Activity Group    Participation Level:  Did Not Attend   Lynelle Smoke [facility] 05/25/2022, 4:29 PM

## 2022-05-25 NOTE — ED Notes (Signed)
Attempted to call report to BMU with no success. Will retry at later time.

## 2022-05-25 NOTE — ED Notes (Signed)
Patient is to be admitted to [facility] by Psychiatric Nurse Practitioner Rashaun.  Attending Physician will be Dr.  Toni Amend .   Patient has been assigned to room 306, by [facility] Edythe Clarity.   Intake Paper Work has been signed and placed on patient chart.  ER staff is aware of the admission:  Pt has been accepted to [facility] on 05/25/22. Dayshift AC to provide exact transport time.

## 2022-05-25 NOTE — ED Notes (Signed)
Clean brief placed and chucks.

## 2022-05-25 NOTE — Group Note (Signed)
[facility] LCSW Group Therapy Note    Group Date: 05/25/2022 Start Time: 1315 End Time: 1409  Type of Therapy and Topic:  Group Therapy:  Overcoming Obstacles  Participation Level:  [facility] PARTICIPATION LEVEL: Did Not Attend   Description of Group:   In this group patients will be encouraged to explore what they see as obstacles to their own wellness and recovery. They will be guided to discuss their thoughts, feelings, and behaviors related to these obstacles. The group will process together ways to cope with barriers, with attention given to specific choices patients can make. Each patient will be challenged to identify changes they are motivated to make in order to overcome their obstacles. This group will be process-oriented, with patients participating in exploration of their own experiences as well as giving and receiving support and challenge from other group members.  Therapeutic Goals: 1. Patient will identify personal and current obstacles as they relate to admission. 2. Patient will identify barriers that currently interfere with their wellness or overcoming obstacles.  3. Patient will identify feelings, thought process and behaviors related to these barriers. 4. Patient will identify two changes they are willing to make to overcome these obstacles:    Summary of Patient Progress X   Therapeutic Modalities:   Cognitive Behavioral Therapy Solution Focused Therapy Motivational Interviewing Relapse Prevention Therapy   Dustee Bottenfield R Eyanna Mcgonagle, LCSW 

## 2022-05-25 NOTE — BH Assessment (Signed)
Patient is to be admitted to [facility] by Psychiatric Nurse Practitioner Rashaun.  Attending Physician will be Dr.  Toni Amend .   Patient has been assigned to room 306, by [facility] Edythe Clarity.   Intake Paper Work has been signed and placed on patient chart.  ER staff is aware of the admission: Armani, ER Secretary   Dr. Katrinka Blazing, ER MD  Coralee North, Patient's Nurse  Isabelle Course, Patient Access.    Pt has been accepted to [facility] on 05/25/22. Dayshift AC to provide exact transport time.

## 2022-05-25 NOTE — Group Note (Signed)
Recreation Therapy Group Note   Group Topic:Goal Setting  Group Date: 05/25/2022 Start Time: 1015 End Time: 1130 Facilitators: Clinton Gallant, CTRS Location: Craft Room  Group Description: Vision Board. Patients were given many different magazines, a glue stick, markers, and a piece of cardstock paper. LRT and pts discussed the importance of having goals in life. LRT and pts discussed the difference between short-term and long-term goals, as well as what a SMART goal is. LRT encouraged pts to create a vision board, with images they picked and then cut out by LRT from the magazine, for themselves, that capture their short and long-term goals. On the back of the paper, pt encouraged to write 3 different coping skills that can help them reach those goals. LRT encouraged pts to show and explain their vision board to the group. LRT offered to laminate vision board once dry and complete.   Goal Area(s) Addressed:  Patient will gain knowledge of short vs. long term goals.  Patient will identify goals for themselves. Patient will practice setting SMART goals.  Affect/Mood: N/A   Participation Level: Did not attend    Clinical Observations/Individualized Feedback: Dominique Baker did not attend group due to being a new admission.  Plan: Continue to engage patient in RT group sessions 2-3x/week.   Dominique Baker, LRT, CTRS 05/25/2022 11:32 AM

## 2022-05-25 NOTE — ED Notes (Signed)
Patient assessed and answered yes to being suicidal with no exact plan but [place] Patient stated she has been suicidal in the past and wanted to [place] her [place] the [place]. Patient not making eye contact during conversation, very soft spoken and withdrawn. BH intake notified of the findings.
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
## 2022-05-25 NOTE — ED Provider Notes (Signed)
Pt received in signout from Dr. Fanny Bien pending reassessment and ambulation trial.  When I go to chat with the patient she begins to elaborate upon increasing vague suicidal thoughts, depression, anhedonia fueling her alcohol abuse.  She does not have any clear plans formulated but I believe psychiatric evaluation would be prudent.  No clear indications for IVC at this point.  Seen by psychiatry who is recommending inpatient psychiatric admission, which think is reasonable.  She is willing to stay and we will keep her voluntary for now  We will order a short course of Augmentin as directed by Dr. Fanny Bien for possible human bite infectious prophylaxis    Delton [place], MD 05/25/22 [phone]
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
## 2022-05-25 NOTE — Tx Team (Signed)
Initial Treatment Plan 05/25/2022 10:20 AM Dominique Baker [id number]    PATIENT STRESSORS: Substance abuse     PATIENT STRENGTHS: Ability for insight  Capable of independent living  Financial means  General fund of knowledge  Motivation for treatment/growth  Work skills    PATIENT IDENTIFIED PROBLEMS: ETOH use  SI thoughts  Depression   Anxiety                DISCHARGE CRITERIA:  Improved stabilization in mood, thinking, and/or behavior Motivation to continue treatment in a less acute level of care Verbal commitment to aftercare and medication compliance  PRELIMINARY DISCHARGE PLAN: Outpatient therapy Return to previous living arrangement Return to previous work or school arrangements  PATIENT/FAMILY INVOLVEMENT: This treatment plan has been presented to and reviewed with the patient, Dominique Baker.  The patient has been given the opportunity to ask questions and make suggestions.  Sofie Hartigan, RN 05/25/2022, 10:20 AM
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
## 2022-05-26 ENCOUNTER — Encounter: Payer: Self-pay | Admitting: Emergency Medicine

## 2022-05-26 ENCOUNTER — Other Ambulatory Visit: Payer: Self-pay

## 2022-05-26 DIAGNOSIS — F1010 Alcohol abuse, uncomplicated: Secondary | ICD-10-CM | POA: Diagnosis not present

## 2022-05-26 DIAGNOSIS — I10 Essential (primary) hypertension: Secondary | ICD-10-CM | POA: Insufficient documentation

## 2022-05-26 MED ORDER — VITAMIN B-1 100 MG PO TABS
100.0000 mg | ORAL_TABLET | Freq: Every day | ORAL | 0 refills | Status: AC
  Filled 2022-05-26: qty 10, 10d supply, fill #0

## 2022-05-26 MED ORDER — ENSURE ENLIVE PO LIQD: 237.0000 mL | Freq: Two times a day (BID) | ORAL | Status: DC

## 2022-05-26 MED ORDER — LABETALOL HCL 100 MG PO TABS
100.0000 mg | ORAL_TABLET | Freq: Two times a day (BID) | ORAL | 0 refills | Status: DC
  Filled 2022-05-26: qty 20, 10d supply, fill #0

## 2022-05-26 MED ORDER — AMOXICILLIN-POT CLAVULANATE 875-125 MG PO TABS
1.0000 | ORAL_TABLET | Freq: Two times a day (BID) | ORAL | 0 refills | Status: AC
  Filled 2022-05-26: qty 20, 10d supply, fill #0

## 2022-05-26 MED ORDER — FLUOXETINE HCL 20 MG PO CAPS
20.0000 mg | ORAL_CAPSULE | Freq: Every day | ORAL | 0 refills | Status: DC
  Filled 2022-05-26: qty 10, 10d supply, fill #0

## 2022-05-26 MED ORDER — VITAMIN B-1 100 MG PO TABS: 100.0000 mg | ORAL_TABLET | Freq: Every day | ORAL | 1 refills | Status: DC

## 2022-05-26 MED ORDER — FLUOXETINE HCL 20 MG PO CAPS: 20.0000 mg | ORAL_CAPSULE | Freq: Every day | ORAL | 1 refills | Status: DC

## 2022-05-26 MED ORDER — LABETALOL HCL 100 MG PO TABS: 100.0000 mg | ORAL_TABLET | Freq: Two times a day (BID) | ORAL | 1 refills | Status: DC

## 2022-05-26 NOTE — [facility] Suicide Risk Assessment (Signed)
[facility] INPATIENT:  Family/Significant Other Suicide Prevention Education  Suicide Prevention Education:  Patient Refusal for Family/Significant Other Suicide Prevention Education: The patient Jenisse Vullo has refused to provide written consent for family/significant other to be provided Family/Significant Other Suicide Prevention Education during admission and/or prior to discharge.  Physician notified.  SPE completed with pt, as pt refused to consent to family contact. SPI pamphlet provided to pt and pt was encouraged to share information with support network, ask questions, and talk about any concerns relating to SPE. Pt denies access to guns/firearms and verbalized understanding of information provided. Mobile Crisis information also provided to pt.  Glenis Smoker 05/26/2022, 10:11 AM

## 2022-05-26 NOTE — Progress Notes (Signed)
NUTRITION ASSESSMENT  Pt identified as at risk on the Malnutrition Screen Tool  INTERVENTION:  -Ensure Enlive po BID, each supplement provides 350 kcal and 20 grams of protein -Continue MVI with minerals daily  NUTRITION DIAGNOSIS: Unintentional weight loss related to sub-optimal intake as evidenced by pt report.   Goal: Pt to meet >/= 90% of their estimated nutrition needs.  Monitor:  PO intake  Assessment:  Pt admitted with alcohol abuse and depression. She was intoxicated upon admission.   Per chart review, pt has not attend group sessions.   Pt is currently on a regular diet. No meal completion data available to assess at this time.   Per H&P, pt does not drinks daily, but when she does drink, she drinks to excess. Suspect diet of poor nutritional quality PTA secondary to ETOH abuse/ binge drinking.   Noted discrepancies in weight history. No information in CareEverywhere for further weight assessment.   Medications reviewed and include folic acid and thiamine.   Labs reviewed.   37 y.o. female  Height: Ht Readings from Last 1 Encounters:  05/25/22  (1.727 m)    Weight: Wt Readings from Last 1 Encounters:  05/25/22 108.9 kg    Weight Hx: Wt Readings from Last 10 Encounters:  05/25/22 108.9 kg  05/24/22 115.1 kg    BMI:  Body mass index is 36.49 kg/m. Pt meets criteria for obesity, class II based on current BMI. Obesity is a complex, chronic medical condition that is optimally managed by a multidisciplinary care team. Weight loss is not an ideal goal for an acute inpatient hospitalization. However, if further work-up for obesity is warranted, consider outpatient referral to [place].    Estimated Nutritional Needs: Kcal: 25-30 kcal/kg Protein: > 1 gram protein/kg Fluid: 1 ml/kcal  Diet Order:  Diet Order             Diet regular Room service appropriate? Yes; Fluid consistency: Thin  Diet effective  now                  Pt is also offered choice of unit snacks mid-morning and mid-afternoon.  Pt is eating as desired.   Lab results and medications reviewed.   Levada Schilling, RD, LDN, CDCES Registered Dietitian II Certified Diabetes Care and Education Specialist Please refer to [facility] for RD and/or RD on-call/weekend/after hours pager

## 2022-05-26 NOTE — Progress Notes (Signed)
Discharge note: Suicide safety plan and survey complete. RN met with pt and reviewed pt's discharge instructions. Pt verbalized understanding of discharge instructions and pt did not have any questions. RN reviewed and provided pt with a copy of SRA, AVS and Transition Record. RN returned pt's belongings to pt. Prescriptions and samples were given to pt. Pt denied SI/HI/AVH and voiced no concerns. Pt was appreciative of the care pt received at [facility]. Patient discharged to the lobby without incident.  05/26/22 0814  Psych Admission Type (Psych Patients Only)  Admission Status Voluntary  Psychosocial Assessment  Patient Complaints Depression;Anxiety  Eye Contact Fair  Facial Expression Anxious;Sad  Affect Anxious;Depressed;Sad  Speech Logical/coherent;Soft  Interaction Isolative;Assertive  Motor Activity Slow;Unsteady  Appearance/Hygiene Unremarkable;In scrubs  Behavior Characteristics Cooperative;Appropriate to situation;Anxious  Mood Depressed;Anxious;Sad  Aggressive Behavior  Effect No apparent injury  Thought Process  Coherency WDL  Content Blaming self  Delusions None reported or observed  Perception WDL  Hallucination None reported or observed  Judgment Impaired  Confusion None  Danger to Self  Current suicidal ideation? Denies  Danger to Others  Danger to Others None reported or observed

## 2022-05-26 NOTE — Progress Notes (Signed)
Patient has been in room since the start of shift. Although she isolates she will engage when approached. CIWA acore this evening was 2 and no additional medication was given or asked for. No issues or concerns to discuss with this nurse on this occasion.  Safe on the unit with q15 min rounding.    C Butler-Nicholson, LPN
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
## 2022-05-26 NOTE — Group Note (Signed)
Date:  05/26/2022 Time:  10:05 AM  Group Topic/Focus:  Goals Group:   The focus of this group is to help patients establish daily goals to achieve during treatment and discuss how the patient can incorporate goal setting into their daily lives to aide in recovery.  Community Meeting   Participation Level:  Did Not Attend   Lynelle Smoke [facility] 05/26/2022, 10:05 AM
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
## 2022-05-26 NOTE — Plan of Care (Signed)
°  Problem: Education: °Goal: Knowledge of General Education information will improve °Description: Including pain rating scale, medication(s)/side effects and non-pharmacologic comfort measures °Outcome: Progressing °  °Problem: Activity: °Goal: Risk for activity intolerance will decrease °Outcome: Not Progressing °  °Problem: Nutrition: °Goal: Adequate nutrition will be maintained °Outcome: Not Progressing °  °Problem: Coping: °Goal: Level of anxiety will decrease °Outcome: Not Progressing °  °

## 2022-05-26 NOTE — Progress Notes (Signed)
  [facility] Adult Case Management Discharge Plan :  Will you be returning to the same living situation after discharge:  Yes,  pt plans to return home upon discharge. At discharge, do you have transportation home?: Yes,  pt support system to provide transportation. Do you have the ability to pay for your medications: Yes,  [place] [employer]/[place] [employer] [place] [employer].  Release of information consent forms completed and in the chart;  Patient's signature needed at discharge.  Patient to Follow up at:  Follow-up Information     [facility] Follow up.   Why: Your appointment is scheduled for Friday, 05/29/22 at 9:00AM. Thanks! Contact information: [address] [place] [address] [phone]                 Next level of care provider has access to [facility] Link:no  Safety Planning and Suicide Prevention discussed: Yes,  SPE completed with pt.      Has patient been referred to the Quitline?: Yes, faxed on 05/26/22.  Patient has been referred for addiction treatment: Yes  Glenis Smoker, LCSW 05/26/2022, 2:00 PM

## 2022-05-26 NOTE — Group Note (Signed)
Recreation Therapy Group Note   Group Topic:Healthy Support Systems  Group Date: 05/26/2022 Start Time: 1000 End Time: 1110 Facilitators: Rosina Lowenstein, LRT, CTRS Location:  Craft Room  Group Description: Straw Bridge. Individually, patients were given 10 plastic drinking straws and an equal length of masking tape. Using the materials provided, patients were instructed to build a free-standing bridge-like structure to suspend an everyday item (ex: puzzle box) off the floor or table surface. All materials were required to be used in Secondary school teacher. LRT facilitated post-activity discussion reviewing how we, humans, are like the structure we built; when things get too heavy in our life and we do not have adequate supports/coping skills, then we will fall just like the straw-built structure will. LRT focused on how having a "base" or structure on the bottom was necessary for the object to stand, meaning we must be secure and stable first before building on ourselves or others. Patients were encouraged to name 2 healthy supports in their life and reflect on how the skills used in this activity can be generalized to daily life post discharge.  Goal Area(s) Addressed:  Patient will identify two healthy support systems in their life. Patient will work on Product manager. Patient will verbalize the importance of having a strong and steady "base".  Patient will follow multi-step directions. Patients will engage in creativity and use all provided materials.  Affect/Mood: N/A   Participation Level: Did not attend    Clinical Observations/Individualized Feedback: Dominique Baker did not attend group due to resting in her room.  Plan: Continue to engage patient in RT group sessions 2-3x/week.   Rosina Lowenstein, LRT, CTRS 05/26/2022 11:33 AM
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
## 2022-05-26 NOTE — Plan of Care (Signed)
  Problem: Nutrition: Goal: Adequate nutrition will be maintained Outcome: Progressing   Problem: Coping: Goal: Level of anxiety will decrease Outcome: Progressing   Problem: Health Behavior/Discharge Planning: Goal: Compliance with therapeutic regimen will improve Outcome: Progressing   Problem: Self-Concept: Goal: Level of anxiety will decrease Outcome: Progressing   Problem: Coping: Goal: Coping ability will improve Outcome: Progressing

## 2022-05-29 ENCOUNTER — Other Ambulatory Visit: Payer: Medicaid Other | Admitting: Licensed Clinical Social Worker
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
## 2022-06-02 ENCOUNTER — Telehealth: Payer: Self-pay | Admitting: Licensed Clinical Social Worker

## 2022-06-02 NOTE — Patient Outreach (Signed)
Care Coordination  06/02/2022  Dominique Baker [birth year] [id number]  [facility] LCSW is in need of rescheduling patient's follow up appointment and attempted to reach patient by phone to reschedule appointment set for 06/08/22 but was unable to reach her or leave a voice message so an email was sent in addition.  Dickie La, BSW, MSW, [employer] Managed Medicaid LCSW [facility]  [facility] [email or web address] Phone: [phone]

## 2022-06-05 ENCOUNTER — Ambulatory Visit: Payer: Medicaid Other | Admitting: Licensed Clinical Social Worker

## 2022-06-08 ENCOUNTER — Ambulatory Visit: Payer: Medicaid Other | Admitting: Licensed Clinical Social Worker

## 2022-06-08 ENCOUNTER — Telehealth: Payer: Self-pay | Admitting: Licensed Clinical Social Worker

## 2022-06-08 NOTE — Patient Outreach (Addendum)
    Medicaid Managed Care   Unsuccessful Attempt Note   06/05/2022 Name: Dominique Baker MRN: [id number] DOB: 08/24/1985  Referred by: [facility] Reason for referral : No chief complaint on file.   An unsuccessful telephone outreach was attempted today. The patient was referred to the case management team for assistance with care management and care coordination.  [facility] lcsw made an additional outreach attempt on 06/07/21 but was unsuccessful and mailbox continues to be full so message cannot be left. Email sent.  Follow Up Plan: The Managed Medicaid care management team will reach out to the patient again over the next 30 days.   Jelissa Espiritu, BSW, MSW, LCSW Managed Medicaid LCSW   [facility] [email or web address] Phone: [phone]   

## 2022-06-15 ENCOUNTER — Telehealth: Payer: Self-pay

## 2022-06-15 NOTE — Telephone Encounter (Signed)
..   Medicaid Managed Care   Unsuccessful Outreach Note  06/15/2022 Name: Dominique Baker MRN: [id number] DOB: [birth year]  Referred by: [facility] Reason for referral : Appointment (I called the patient today to reschedule her missed phone appointment with the [facility] LCSW. I had to leave a message on her voicemail.)   A second unsuccessful telephone outreach was attempted today. The patient was referred to the case management team for assistance with care management and care coordination.   Follow Up Plan: A HIPAA compliant phone message was left for the patient providing contact information and requesting a return call.  The care management team will reach out to the patient again over the next 7 days.   Weston Settle Care Guide  [facility] Managed  Care Guide [facility]  [phone]

## 2022-06-16 ENCOUNTER — Encounter: Payer: Self-pay | Admitting: Internal Medicine

## 2022-06-16 ENCOUNTER — Ambulatory Visit (INDEPENDENT_AMBULATORY_CARE_PROVIDER_SITE_OTHER): Payer: Medicaid Other | Admitting: Internal Medicine

## 2022-06-16 VITALS — BP 134/62 | HR 62 | Ht 67.0 in | Wt 250.0 lb

## 2022-06-16 DIAGNOSIS — I10 Essential (primary) hypertension: Secondary | ICD-10-CM

## 2022-06-16 DIAGNOSIS — B3731 Acute candidiasis of vulva and vagina: Secondary | ICD-10-CM

## 2022-06-16 DIAGNOSIS — F3289 Other specified depressive episodes: Secondary | ICD-10-CM | POA: Diagnosis not present

## 2022-06-16 DIAGNOSIS — F411 Generalized anxiety disorder: Secondary | ICD-10-CM | POA: Diagnosis not present

## 2022-06-16 DIAGNOSIS — F1010 Alcohol abuse, uncomplicated: Secondary | ICD-10-CM

## 2022-06-16 MED ORDER — TOPIRAMATE 25 MG PO TABS: 25.0000 mg | ORAL_TABLET | Freq: Every day | ORAL | 2 refills | Status: DC

## 2022-06-16 MED ORDER — FLUCONAZOLE 150 MG PO TABS: 150.0000 mg | ORAL_TABLET | Freq: Every day | ORAL | 0 refills | Status: DC
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
## 2022-06-22 ENCOUNTER — Telehealth: Payer: Self-pay

## 2022-06-22 NOTE — Telephone Encounter (Signed)
..   Medicaid Managed Care   Unsuccessful Outreach Note  06/22/2022 Name: Dominique Baker MRN: [id number] DOB: 02/06/1985  Referred by: Margaretann Loveless, MD Reason for referral : Appointment   Third unsuccessful telephone outreach was attempted today. The patient was referred to the case management team for assistance with care management and care coordination. The patient's primary care provider has been notified of our unsuccessful attempts to make or maintain contact with the patient. The care management team is pleased to engage with this patient at any time in the future should he/she be interested in assistance from the care management team.   Follow Up Plan: We have been unable to make contact with the patient for follow up. The care management team is available to follow up with the patient after provider conversation with the patient regarding recommendation for care management engagement and subsequent re-referral to the care management team.   Weston Settle Care Guide  [facility] Managed  Care Guide [facility]  [phone]

## 2022-06-30 ENCOUNTER — Ambulatory Visit: Payer: Medicaid Other | Admitting: Internal Medicine

## 2022-08-08 ENCOUNTER — Other Ambulatory Visit: Payer: Self-pay | Admitting: Internal Medicine

## 2022-09-03 ENCOUNTER — Other Ambulatory Visit: Payer: Self-pay | Admitting: Internal Medicine

## 2022-09-03 DIAGNOSIS — J029 Acute pharyngitis, unspecified: Secondary | ICD-10-CM | POA: Diagnosis not present

## 2022-09-03 DIAGNOSIS — H9202 Otalgia, left ear: Secondary | ICD-10-CM | POA: Diagnosis not present

## 2022-09-16 ENCOUNTER — Other Ambulatory Visit: Payer: Self-pay | Admitting: Internal Medicine

## 2022-10-08 ENCOUNTER — Other Ambulatory Visit: Payer: Self-pay | Admitting: Internal Medicine

## 2022-10-08 MED ORDER — FLUOXETINE HCL 40 MG PO CAPS: 40.0000 mg | ORAL_CAPSULE | Freq: Every day | ORAL | 1 refills | Status: DC

## 2023-05-14 ENCOUNTER — Other Ambulatory Visit: Payer: Self-pay | Admitting: Internal Medicine

## 2023-06-03 ENCOUNTER — Ambulatory Visit: Admitting: Internal Medicine

## 2023-06-03 ENCOUNTER — Encounter: Payer: Self-pay | Admitting: Internal Medicine

## 2023-06-03 VITALS — BP 138/86 | HR 68 | Ht 67.0 in | Wt 278.8 lb

## 2023-06-03 DIAGNOSIS — Z1389 Encounter for screening for other disorder: Secondary | ICD-10-CM

## 2023-06-03 DIAGNOSIS — R5383 Other fatigue: Secondary | ICD-10-CM

## 2023-06-03 DIAGNOSIS — F411 Generalized anxiety disorder: Secondary | ICD-10-CM

## 2023-06-03 DIAGNOSIS — F1010 Alcohol abuse, uncomplicated: Secondary | ICD-10-CM | POA: Diagnosis not present

## 2023-06-03 DIAGNOSIS — E559 Vitamin D deficiency, unspecified: Secondary | ICD-10-CM

## 2023-06-03 DIAGNOSIS — Z6841 Body Mass Index (BMI) 40.0 and over, adult: Secondary | ICD-10-CM | POA: Diagnosis not present

## 2023-06-03 DIAGNOSIS — I10 Essential (primary) hypertension: Secondary | ICD-10-CM

## 2023-06-03 LAB — POCT URINALYSIS DIPSTICK
Bilirubin, UA: NEGATIVE
Blood, UA: NEGATIVE
Glucose, UA: NEGATIVE
Ketones, UA: NEGATIVE
Leukocytes, UA: NEGATIVE
Nitrite, UA: NEGATIVE
Protein, UA: NEGATIVE
Spec Grav, UA: 1.02 (ref 1.010–1.025)
Urobilinogen, UA: 0.2 U/dL
pH, UA: 7.5 (ref 5.0–8.0)
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
## 2023-06-04 ENCOUNTER — Ambulatory Visit: Admitting: Internal Medicine

## 2023-06-04 LAB — CBC WITH DIFFERENTIAL/PLATELET
Basophils Absolute: 0 10*3/uL (ref 0.0–0.2)
Basos: 1 %
EOS (ABSOLUTE): 0.2 10*3/uL (ref 0.0–0.4)
Eos: 3 %
Hematocrit: 43.1 % (ref 34.0–46.6)
Hemoglobin: 13.4 g/dL (ref 11.1–15.9)
Immature Grans (Abs): 0 10*3/uL (ref 0.0–0.1)
Immature Granulocytes: 0 %
Lymphocytes Absolute: 1.6 10*3/uL (ref 0.7–3.1)
Lymphs: 24 %
MCH: 32 pg (ref 26.6–33.0)
MCHC: 31.1 g/dL — ABNORMAL LOW (ref 31.5–35.7)
MCV: 103 fL — ABNORMAL HIGH (ref 79–97)
Monocytes Absolute: 0.5 10*3/uL (ref 0.1–0.9)
Monocytes: 8 %
Neutrophils Absolute: 4.4 10*3/uL (ref 1.4–7.0)
Neutrophils: 64 %
Platelets: 265 10*3/uL (ref 150–450)
RBC: 4.19 x10E6/uL (ref 3.77–5.28)
RDW: 11.4 % — ABNORMAL LOW (ref 11.7–15.4)
WBC: 6.8 10*3/uL (ref 3.4–10.8)

## 2023-06-04 LAB — CMP14+EGFR
ALT: 11 IU/L (ref 0–32)
AST: 18 IU/L (ref 0–40)
Albumin: 3.9 g/dL (ref 3.9–4.9)
Alkaline Phosphatase: 79 IU/L (ref 44–121)
BUN/Creatinine Ratio: 10 (ref 9–23)
BUN: 10 mg/dL (ref 6–20)
Bilirubin Total: 0.3 mg/dL (ref 0.0–1.2)
CO2: 23 mmol/L (ref 20–29)
Calcium: 8.9 mg/dL (ref 8.7–10.2)
Chloride: 106 mmol/L (ref 96–106)
Creatinine, Ser: 0.96 mg/dL (ref 0.57–1.00)
Globulin, Total: 2.6 g/dL (ref 1.5–4.5)
Glucose: 79 mg/dL (ref 70–99)
Potassium: 4.2 mmol/L (ref 3.5–5.2)
Sodium: 140 mmol/L (ref 134–144)
Total Protein: 6.5 g/dL (ref 6.0–8.5)
eGFR: 78 mL/min/{1.73_m2} (ref >59)

## 2023-06-04 LAB — LIPID PANEL W/O CHOL/HDL RATIO
Cholesterol, Total: 177 mg/dL (ref 100–199)
HDL: 45 mg/dL (ref >39)
LDL Chol Calc (NIH): 118 mg/dL — ABNORMAL HIGH (ref 0–99)
Triglycerides: 72 mg/dL (ref 0–149)
VLDL Cholesterol Cal: 14 mg/dL (ref 5–40)

## 2023-06-04 LAB — TSH+T4F+T3FREE
Free T4: 1.17 ng/dL (ref 0.82–1.77)
T3, Free: 2.7 pg/mL (ref 2.0–4.4)
TSH: 2.72 u[IU]/mL (ref 0.450–4.500)

## 2023-06-04 LAB — VITAMIN B12: Vitamin B-12: 417 pg/mL (ref 232–1245)

## 2023-06-04 LAB — FOLATE RBC
Folate, Hemolysate: 350 ng/mL
Folate, RBC: 812 ng/mL (ref >498)

## 2023-06-04 LAB — VITAMIN D 25 HYDROXY (VIT D DEFICIENCY, FRACTURES): Vit D, 25-Hydroxy: 11.5 ng/mL — ABNORMAL LOW (ref 30.0–100.0)

## 2023-06-07 ENCOUNTER — Other Ambulatory Visit: Payer: Self-pay | Admitting: Internal Medicine

## 2023-06-07 DIAGNOSIS — E559 Vitamin D deficiency, unspecified: Secondary | ICD-10-CM

## 2023-06-07 MED ORDER — VITAMIN D3 1.25 MG (50000 UT) PO CAPS
1.0000 | ORAL_CAPSULE | ORAL | 3 refills | Status: AC
Start: 2023-06-07 — End: ?

## 2023-06-18 ENCOUNTER — Encounter: Payer: Self-pay | Admitting: Internal Medicine

## 2023-06-18 ENCOUNTER — Ambulatory Visit: Admitting: Internal Medicine

## 2023-06-18 ENCOUNTER — Telehealth: Payer: Self-pay | Admitting: Internal Medicine

## 2023-06-18 VITALS — BP 128/86 | HR 62 | Ht 67.0 in | Wt 278.6 lb

## 2023-06-18 DIAGNOSIS — Z6841 Body Mass Index (BMI) 40.0 and over, adult: Secondary | ICD-10-CM

## 2023-06-18 DIAGNOSIS — F411 Generalized anxiety disorder: Secondary | ICD-10-CM

## 2023-06-18 DIAGNOSIS — E559 Vitamin D deficiency, unspecified: Secondary | ICD-10-CM | POA: Diagnosis not present

## 2023-06-18 DIAGNOSIS — F1010 Alcohol abuse, uncomplicated: Secondary | ICD-10-CM | POA: Diagnosis not present

## 2023-06-18 DIAGNOSIS — I10 Essential (primary) hypertension: Secondary | ICD-10-CM | POA: Diagnosis not present

## 2023-06-18 MED ORDER — TOPIRAMATE 25 MG PO TABS: 25.0000 mg | ORAL_TABLET | Freq: Every day | ORAL | 2 refills | Status: AC

## 2023-06-18 MED ORDER — ZEPBOUND 2.5 MG/0.5ML ~~LOC~~ SOAJ: 2.5000 mg | SUBCUTANEOUS | 0 refills | Status: DC
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
## 2023-06-18 NOTE — Telephone Encounter (Signed)
 Patient left VM that she was in this morning for an appointment with Dr. Meredeth Stallion and she sent her in a medication that requires a PA. She wants to know when this will be completed.   Called patient back and left detailed VM explaining the PA process and that I will complete it when I receive the request from the pharmacy.

## 2023-06-23 ENCOUNTER — Other Ambulatory Visit: Payer: Self-pay | Admitting: Family

## 2023-06-23 ENCOUNTER — Other Ambulatory Visit: Payer: Self-pay

## 2023-06-24 MED ORDER — FLUOXETINE HCL 40 MG PO CAPS: 40.0000 mg | ORAL_CAPSULE | Freq: Every day | ORAL | 1 refills | Status: DC

## 2023-06-27 ENCOUNTER — Other Ambulatory Visit: Payer: Self-pay | Admitting: Family

## 2023-07-07 ENCOUNTER — Other Ambulatory Visit: Payer: Self-pay | Admitting: Internal Medicine

## 2023-07-08 MED ORDER — WEGOVY 0.25 MG/0.5ML ~~LOC~~ SOAJ: 0.2500 mg | SUBCUTANEOUS | 0 refills | Status: AC

## 2023-07-09 ENCOUNTER — Ambulatory Visit: Admitting: Internal Medicine

## 2023-07-13 ENCOUNTER — Telehealth: Payer: Self-pay | Admitting: Internal Medicine

## 2023-07-13 NOTE — Telephone Encounter (Signed)
 Patient left VM wanting to check status of her PA.   I received the request from pharmacy and will take care of this tomorrow.

## 2023-07-19 ENCOUNTER — Ambulatory Visit: Admitting: Internal Medicine

## 2023-08-08 ENCOUNTER — Emergency Department
Admission: EM | Admit: 2023-08-08 | Discharge: 2023-08-09 | Disposition: A | Discharge status: Home / Self Care | Attending: Emergency Medicine | Admitting: Emergency Medicine

## 2023-08-08 DIAGNOSIS — R4689 Other symptoms and signs involving appearance and behavior: Secondary | ICD-10-CM | POA: Diagnosis not present

## 2023-08-08 DIAGNOSIS — Z046 Encounter for general psychiatric examination, requested by authority: Secondary | ICD-10-CM

## 2023-08-08 DIAGNOSIS — R259 Unspecified abnormal involuntary movements: Secondary | ICD-10-CM | POA: Diagnosis not present

## 2023-08-08 DIAGNOSIS — F10920 Alcohol use, unspecified with intoxication, uncomplicated: Secondary | ICD-10-CM

## 2023-08-08 DIAGNOSIS — I10 Essential (primary) hypertension: Secondary | ICD-10-CM | POA: Diagnosis not present

## 2023-08-08 DIAGNOSIS — F1014 Alcohol abuse with alcohol-induced mood disorder: Secondary | ICD-10-CM | POA: Insufficient documentation

## 2023-08-08 DIAGNOSIS — F17210 Nicotine dependence, cigarettes, uncomplicated: Secondary | ICD-10-CM | POA: Insufficient documentation

## 2023-08-08 DIAGNOSIS — F411 Generalized anxiety disorder: Secondary | ICD-10-CM | POA: Insufficient documentation

## 2023-08-08 DIAGNOSIS — F1994 Other psychoactive substance use, unspecified with psychoactive substance-induced mood disorder: Secondary | ICD-10-CM

## 2023-08-08 DIAGNOSIS — Y909 Presence of alcohol in blood, level not specified: Secondary | ICD-10-CM | POA: Insufficient documentation

## 2023-08-08 DIAGNOSIS — J45909 Unspecified asthma, uncomplicated: Secondary | ICD-10-CM | POA: Diagnosis not present

## 2023-08-08 DIAGNOSIS — F1094 Alcohol use, unspecified with alcohol-induced mood disorder: Secondary | ICD-10-CM | POA: Diagnosis present

## 2023-08-08 MED ORDER — DIPHENHYDRAMINE HCL 50 MG/ML IJ SOLN
50.0000 mg | Freq: Once | INTRAMUSCULAR | Status: AC
  Administered 2023-08-08: 50 mg via INTRAMUSCULAR
  Filled 2023-08-08: qty 1

## 2023-08-08 MED ORDER — LORAZEPAM 2 MG/ML IJ SOLN
2.0000 mg | Freq: Once | INTRAMUSCULAR | Status: AC
  Administered 2023-08-08: 2 mg via INTRAMUSCULAR
  Filled 2023-08-08: qty 1

## 2023-08-08 MED ORDER — HALOPERIDOL LACTATE 5 MG/ML IJ SOLN
5.0000 mg | Freq: Once | INTRAMUSCULAR | Status: AC
  Administered 2023-08-08: 5 mg via INTRAMUSCULAR
  Filled 2023-08-08: qty 1

## 2023-08-08 NOTE — ED Notes (Signed)
 Assumed care of patient, patient resting with eyes shut in bed, no distress noted. Respirations even and unlabored.

## 2023-08-08 NOTE — ED Notes (Addendum)
 Pt belongings:  A wig A black shirt Black pants A tooth cap  Nurse and security aware of piercings. Attempted to get ring off with lotion but ring is still in place pt. Was told that if she did not stay calm we would cut ring off.
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
## 2023-08-08 NOTE — ED Notes (Signed)
 Patient argumentative with staff, refusing to stay in room or let staff complete v/s and blood draw at this time. Patient not dressed out due to being non compliant at this time

## 2023-08-08 NOTE — BH Assessment (Signed)
 TTS was unable to complete the consult at this time. Patient unable to participate in the interview because of the IM medications she received.

## 2023-08-08 NOTE — ED Notes (Signed)
Pt given dinner tray and drink 

## 2023-08-08 NOTE — ED Notes (Signed)
 IVC/pending psych consult

## 2023-08-08 NOTE — ED Notes (Addendum)
 Pt daughter phone number, call for any updates. [phone]
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
## 2023-08-08 NOTE — ED Notes (Signed)
Patient speaking with telepsych now.

## 2023-08-08 NOTE — ED Notes (Signed)
 Once in quad room, patient was dressed out into hospital attire (scrubs), vitals obtained, blood draw obtained, and medication given to calm down. Tasks completed by this RN, Hadassah (tech) and security. See [facility]

## 2023-08-08 NOTE — ED Provider Notes (Signed)
-----------------------------------------   8:44 PM on 08/08/2023 -----------------------------------------  The patient has been evaluated by the telepsychiatrist Dr. Lyle he.  Per his note, the patient is a moderate risk, and he additionally notes If she can maintain safety in ED environment, she'd be a candidate for discharge to OP level of care. If she deteriorates or acts unsafe like she did upon arrival, this would speak less to her symptoms being primarily related to intoxication and she may require Psychiatric hospitalization.  Therefore, at this time I do not feel that the patient is appropriate for discharge.  She will need further monitoring in the ED.  She will be signed out to the oncoming ED and psychiatric team.   Jacolyn Pae, MD 08/08/23 2045

## 2023-08-08 NOTE — ED Notes (Signed)
 Patients daughter called wanting an update on patient. This RN called daughter at the number provided, no answer.

## 2023-08-08 NOTE — BH Assessment (Signed)
 TTS communicated with [facility] about Psych Consult, via phone call and secure chat.

## 2023-08-09 ENCOUNTER — Other Ambulatory Visit: Payer: Self-pay | Admitting: Internal Medicine

## 2023-08-09 DIAGNOSIS — F1994 Other psychoactive substance use, unspecified with psychoactive substance-induced mood disorder: Secondary | ICD-10-CM | POA: Diagnosis not present

## 2023-08-09 NOTE — BH Assessment (Signed)
 Collateral contact with patient's significant other Eloy Molt [phone]).  With patient's consent, TTS spoke with Mr. Molt regarding plan of care and for patient to be discharged with follow up with behavioral health provider of her choice.  Mr. Molt was in agreement with the planned discharge and states patient will be safe at home.

## 2023-08-09 NOTE — ED Notes (Signed)
Ivc/ psych consult complete 

## 2023-08-09 NOTE — ED Provider Notes (Signed)
 Clinical Course as of 08/09/23 1530  Mon Aug 09, 2023  1232 Seen and evaluated by psychiatry who agrees to rescind IVC and that patient is suitable for outpatient management [DS]    Clinical Course User Index [DS] Claudene Rover, MD       Claudene Rover, MD 08/09/23 [phone]

## 2023-08-09 NOTE — Consult Note (Signed)
 [facility] {CHL [facility] INITIAL OR FOLLOW LE:68184}  Patient Name: .Dominique Baker  MRN: [id number]  DOB: [birth year]  Consult Order details:  Orders (From admission, onward)     Start     Ordered   08/09/23 0759  IP CONSULT TO PSYCHIATRY       Comments: Needs reeval due to IRIS reporting patient was at moderate risk and needed to maintain safety in the ED before discharging  Ordering Provider: Claudene Rover, MD  Provider:  (Not yet assigned)  Question Answer Comment  Place call to: admit   Reason for Consult Admit      08/09/23 0759   08/08/23 0845  CONSULT TO CALL ACT TEAM       Ordering Provider: Floy Roberts, MD  Provider:  (Not yet assigned)  Question:  Reason for Consult?  Answer:  IVC   08/08/23 0844   08/08/23 0845  IP CONSULT TO PSYCHIATRY       Ordering Provider: Floy Roberts, MD  Provider:  (Not yet assigned)  Question Answer Comment  Place call to: psychiatry   Reason for Consult Consult   Diagnosis/Clinical Info for Consult: IVC      08/08/23 0844             Mode of Visit: {Type of visit:31911}    Psychiatry Consult Evaluation  Service Date: August 09, 2023 LOS:  LOS: 0 days  Chief Complaint ***  Primary Psychiatric Diagnoses  *** 2.  *** 3.  ***  Assessment  Dominique Baker is a 38 y.o. female admitted: {CHL BH Medical or Presented to [facility] 08/08/2023  7:44 AM for ***. She carries the psychiatric diagnoses of *** and has a past medical history of  ***.      Diagnoses:  Active Hospital problems: Active Problems:   * No active hospital problems. *    Plan   ## Psychiatric Medication Recommendations:  ***  ## Medical Decision Making Capacity: {CHL BH MEDICAL DECISION MAKING CAPACITY:31818}  ## Further Work-up:  -- *** {CHLmacgeneralandspecificworkuprecs:31821} -- most recent EKG on *** had QtC of *** -- Pertinent labwork reviewed earlier this admission includes: ***   ## Disposition:--  {CHLmaccldispo:31820}  ## Behavioral / Environmental: -{CHLmacbehavioralenvironmental2:31847}    ## Safety and Observation Level:  - Based on my clinical evaluation, I estimate the patient to be at *** risk of self harm in the current setting. - At this time, we recommend  {CHL BH SUICIDE OBSERVATION LEVEL:31850}. This decision is based on my review of the chart including patient's history and current presentation, interview of the patient, mental status examination, and consideration of suicide risk including evaluating suicidal ideation, plan, intent, suicidal or self-harm behaviors, risk factors, and protective factors. This judgment is based on our ability to directly address suicide risk, implement suicide prevention strategies, and develop a safety plan while the patient is in the clinical setting. Please contact our team if there is a concern that risk level has changed.  CSSR Risk Category:   Suicide Risk Assessment: Patient has following modifiable risk factors for suicide: {CHLmacmodifiablesuicideriskfactors:31822}, which we are addressing by ***. Patient has following non-modifiable or demographic risk factors for suicide: {CHLmacnonmodifiablesuicideriskfactors:31823} Patient has the following protective factors against suicide: {CHLmacprotectivefactors:31824}  Thank you for this consult request. Recommendations have been communicated to the primary team.  We will *** at this time.   Allyn Foil, MD       History of Present Illness  Relevant Aspects of [facility]  or ED course:31819} Course:  Admitted on 08/08/2023 for ***. They ***.   Patient Report:  ***  Psych ROS:  Depression: *** Anxiety:  *** Mania (lifetime and current): *** Psychosis: (lifetime and current): ***  Collateral information:  Contacted *** at *** on ***  ROS   Psychiatric and Social History  Psychiatric History:  Information collected from ***  Prev Dx/Sx: *** Current Psych  Provider: *** Home Meds (current): *** Previous Med Trials: *** Therapy: ***  Prior Psych Hospitalization: ***  Prior Self Harm: *** Prior Violence: ***  Family Psych History: *** Family Hx suicide: ***  Social History:  Developmental Hx: *** Educational Hx: *** Occupational Hx: *** Legal Hx: *** Living Situation: *** Spiritual Hx: *** Access to weapons/lethal means: ***   Substance History Alcohol: ***  Type of alcohol *** Last Drink *** Number of drinks per day *** History of alcohol withdrawal seizures *** History of DT's *** Tobacco: *** Illicit drugs: *** Prescription drug abuse: *** Rehab hx: ***  Exam Findings  Physical Exam: *** Vital Signs:  Temp:  [98.3 F (36.8 C)-98.4 F (36.9 C)] 98.3 F (36.8 C) (07/07 1318) Pulse Rate:  [82-89] 82 (07/07 1318) Resp:  [18] 18 (07/07 1318) BP: (144-159)/(79-94) 159/90 (07/07 1318) SpO2:  [100 %] 100 % (07/07 1318) Blood pressure (!) 159/90, pulse 82, temperature 98.3 F (36.8 C), temperature source Oral, resp. rate 18, SpO2 100%, unknown if currently breastfeeding. There is no height or weight on file to calculate BMI.    Mental Status Exam: General Appearance: {Appearance:22683}  Orientation:  {BHH ORIENTATION (PAA):22689}  Memory:  {BHH MEMORY:22881}  Concentration:  {Concentration:21399}  Recall:  {BHH GOOD/FAIR/POOR:22877}  Attention  {BH Attention Span:31825}  Eye Contact:  {BHH EYE CONTACT:22684}  Speech:  {Speech:22685}  Language:  {BHH GOOD/FAIR/POOR:22877}  Volume:  {Volume (PAA):22686}  Mood: ***  Affect:  {Affect (PAA):22687}  Thought Process:  {Thought Process (PAA):22688}  Thought Content:  {Thought Content:22690}  Suicidal Thoughts:  {ST/HT (PAA):22692}  Homicidal Thoughts:  {ST/HT (PAA):22692}  Judgement:  {Judgement (PAA):22694}  Insight:  {Insight (PAA):22695}  Psychomotor Activity:  {Psychomotor (PAA):22696}  Akathisia:  {BHH YES OR NO:22294}  Fund of Knowledge:  {BHH  GOOD/FAIR/POOR:22877}      Assets:  {Assets (PAA):22698}  Cognition:  {chl bhh cognition:304700322}  ADL's:  {BHH JIO'D:77709}  AIMS (if indicated):        Other History   These have been pulled in through the EMR, reviewed, and updated if appropriate.  Family History:  The patient's family history includes Diabetes in her mother; Hypertension in her mother; Sickle cell anemia in her mother; Sickle cell trait in her brother, brother, brother, daughter, sister, sister, sister, sister, sister, son, son, and son.  Medical History: Past Medical History:  Diagnosis Date   Asthma    Hypertension    Chronic    Surgical History: Past Surgical History:  Procedure Laterality Date   TUBAL LIGATION Bilateral 09/11/2018   Procedure: POST PARTUM TUBAL LIGATION;  Surgeon: Verdon Keen, MD;  Location: [facility] ORS;  Service: Gynecology;  Laterality: Bilateral;     Medications:  No current facility-administered medications for this encounter.  Current Outpatient Medications:    Cholecalciferol (VITAMIN D3) 1.25 MG (50000 UT) CAPS, Take 1 capsule (1.25 mg total) by mouth once a week., Disp: 12 capsule, Rfl: 3   FLUoxetine  (PROZAC ) 40 MG capsule, Take 1 capsule (40 mg total) by mouth daily., Disp: 90 capsule, Rfl: 1   topiramate  (TOPAMAX ) 25 MG tablet, Take  1 tablet (25 mg total) by mouth at bedtime., Disp: 90 tablet, Rfl: 2   WEGOVY  0.25 MG/0.5ML SOAJ, Inject 0.25 mg into the skin every 7 (seven) days., Disp: , Rfl:    fluconazole  (DIFLUCAN ) 150 MG tablet, Take 1 tablet (150 mg total) by mouth daily. (Patient not taking: Reported on 06/03/2023), Disp: 3 tablet, Rfl: 0   labetalol  (NORMODYNE ) 100 MG tablet, Take 1 tablet (100 mg total) by mouth 2 (two) times daily. (Patient not taking: Reported on 03/04/2021), Disp: 30 tablet, Rfl: 1   labetalol  (NORMODYNE ) 100 MG tablet, Take 1 tablet (100 mg total) by mouth 2 (two) times daily. (Patient not taking: Reported on 06/03/2023), Disp: 20 tablet, Rfl: 0    labetalol  (NORMODYNE ) 300 MG tablet, Take 1 tablet (300 mg total) by mouth 3 (three) times daily. (Patient not taking: Reported on 08/08/2023), Disp: 90 tablet, Rfl: 1   thiamine  (VITAMIN B-1) 100 MG tablet, Take 1 tablet (100 mg total) by mouth daily. (Patient not taking: Reported on 06/03/2023), Disp: 10 tablet, Rfl: 0   tirzepatide  (ZEPBOUND ) 2.5 MG/0.5ML Pen, Inject 2.5 mg into the skin once a week. (Patient not taking: Reported on 08/08/2023), Disp: 2 mL, Rfl: 0  Allergies: No Known Allergies  Dominique Earnhart, MD

## 2023-08-09 NOTE — ED Notes (Signed)
 Patient given belongings bag 1/1 at time of discharge.

## 2023-08-09 NOTE — ED Provider Notes (Signed)
 Emergency Medicine Observation Re-evaluation Note  Lafreda Casebeer is a 38 y.o. female, seen on rounds today.  Pt initially presented to the ED for complaints of No chief complaint on file. Currently, the patient is resting no acute distress.  Physical Exam  BP (!) 155/94 (BP Location: Left Arm)   Pulse 89   Temp 98.3 F (36.8 C) (Oral)   Resp 18   SpO2 100%  Physical Exam General: Resting in no acute distress  ED Course / MDM  EKG:   I have reviewed the labs performed to date as well as medications administered while in observation.  Recent changes in the last 24 hours include no acute events.  Plan  Current plan is for disposition per psychiatry. Eval by psychiatry yesterday, felt to be moderate risk, considered possibly stable for discharge home as she remained calm and emergent department.  Allowing patient to sleep in department overnight, tentative plan for psychiatry reevaluation in the morning to ensure she is cleared for discharge.   Clarine Ozell LABOR, MD 08/09/23 [phone]

## 2023-08-09 NOTE — ED Notes (Signed)
Patient calling ride at this time.

## 2023-08-10 ENCOUNTER — Ambulatory Visit: Admitting: Internal Medicine

## 2023-08-10 ENCOUNTER — Other Ambulatory Visit: Payer: Self-pay | Admitting: Internal Medicine

## 2023-08-10 MED ORDER — WEGOVY 0.5 MG/0.5ML ~~LOC~~ SOAJ
0.5000 mg | SUBCUTANEOUS | 0 refills | Status: DC
Start: 2023-08-10 — End: 2023-10-06

## 2023-08-19 ENCOUNTER — Ambulatory Visit: Admitting: Internal Medicine

## 2023-10-06 ENCOUNTER — Other Ambulatory Visit: Payer: Self-pay | Admitting: Internal Medicine

## 2023-10-07 MED ORDER — WEGOVY 1 MG/0.5ML ~~LOC~~ SOAJ: 1.0000 mg | SUBCUTANEOUS | 0 refills | Status: DC

## 2023-10-28 ENCOUNTER — Encounter: Payer: Self-pay | Admitting: Internal Medicine

## 2023-10-28 ENCOUNTER — Ambulatory Visit: Admitting: Internal Medicine

## 2023-10-28 VITALS — BP 126/68 | HR 72 | Ht 67.0 in | Wt 265.6 lb

## 2023-10-28 DIAGNOSIS — F339 Major depressive disorder, recurrent, unspecified: Secondary | ICD-10-CM | POA: Diagnosis not present

## 2023-10-28 DIAGNOSIS — Z6841 Body Mass Index (BMI) 40.0 and over, adult: Secondary | ICD-10-CM

## 2023-10-28 DIAGNOSIS — E559 Vitamin D deficiency, unspecified: Secondary | ICD-10-CM | POA: Insufficient documentation

## 2023-10-28 DIAGNOSIS — I10 Essential (primary) hypertension: Secondary | ICD-10-CM

## 2023-10-28 DIAGNOSIS — R5383 Other fatigue: Secondary | ICD-10-CM | POA: Insufficient documentation

## 2023-10-28 DIAGNOSIS — F411 Generalized anxiety disorder: Secondary | ICD-10-CM

## 2023-10-28 MED ORDER — FLUOXETINE HCL 40 MG PO CAPS: 40.0000 mg | ORAL_CAPSULE | Freq: Every day | ORAL | 1 refills | Status: AC

## 2023-10-28 MED ORDER — WEGOVY 1 MG/0.5ML ~~LOC~~ SOAJ: 1.0000 mg | SUBCUTANEOUS | 0 refills | Status: AC

## 2023-10-28 MED ORDER — LABETALOL HCL 300 MG PO TABS: 300.0000 mg | ORAL_TABLET | Freq: Three times a day (TID) | ORAL | 1 refills | Status: AC
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
## 2023-12-13 ENCOUNTER — Ambulatory Visit: Admitting: Internal Medicine
